# Patient Record
Sex: Female | Born: 1970 | Race: Black or African American | Hispanic: No | Marital: Married | Smoking: Never smoker
Health system: Southern US, Community
[De-identification: ages and names within clinical notes are randomized; demographics above are authoritative.]

## PROBLEM LIST (undated history)

## (undated) DIAGNOSIS — D219 Benign neoplasm of connective and other soft tissue, unspecified: Secondary | ICD-10-CM

## (undated) DIAGNOSIS — N7011 Chronic salpingitis: Secondary | ICD-10-CM

## (undated) HISTORY — DX: Benign neoplasm of connective and other soft tissue, unspecified: D21.9

## (undated) HISTORY — PX: OTHER SURGICAL HISTORY: SHX169

## (undated) HISTORY — DX: Chronic salpingitis: N70.11

---

## 2004-06-04 ENCOUNTER — Emergency Department (HOSPITAL_COMMUNITY): Admission: EM | Admit: 2004-06-04 | Discharge: 2004-06-04 | Payer: Self-pay | Admitting: Emergency Medicine

## 2013-08-13 ENCOUNTER — Ambulatory Visit: Payer: BC Managed Care – PPO | Admitting: Emergency Medicine

## 2013-08-13 VITALS — BP 115/70 | HR 92 | Temp 97.9°F | Resp 18 | Ht 65.0 in | Wt 169.9 lb

## 2013-08-13 DIAGNOSIS — R519 Headache, unspecified: Secondary | ICD-10-CM

## 2013-08-13 DIAGNOSIS — R51 Headache: Secondary | ICD-10-CM

## 2013-08-13 DIAGNOSIS — R300 Dysuria: Secondary | ICD-10-CM

## 2013-08-13 DIAGNOSIS — R350 Frequency of micturition: Secondary | ICD-10-CM

## 2013-08-13 LAB — COMPREHENSIVE METABOLIC PANEL
ALK PHOS: 52 U/L (ref 39–117)
ALT: 12 U/L (ref 0–35)
AST: 21 U/L (ref 0–37)
Albumin: 4.2 g/dL (ref 3.5–5.2)
BILIRUBIN TOTAL: 0.8 mg/dL (ref 0.2–1.2)
BUN: 9 mg/dL (ref 6–23)
CO2: 25 mEq/L (ref 19–32)
CREATININE: 0.49 mg/dL — AB (ref 0.50–1.10)
Calcium: 9 mg/dL (ref 8.4–10.5)
Chloride: 104 mEq/L (ref 96–112)
Glucose, Bld: 79 mg/dL (ref 70–99)
Potassium: 3.8 mEq/L (ref 3.5–5.3)
Sodium: 136 mEq/L (ref 135–145)
Total Protein: 7.6 g/dL (ref 6.0–8.3)

## 2013-08-13 LAB — POCT CBC
Granulocyte percent: 58.6 %G (ref 37–80)
HCT, POC: 34.7 % — AB (ref 37.7–47.9)
Hemoglobin: 10.4 g/dL — AB (ref 12.2–16.2)
Lymph, poc: 1.8 (ref 0.6–3.4)
MCH: 25.6 pg — AB (ref 27–31.2)
MCHC: 30 g/dL — AB (ref 31.8–35.4)
MCV: 85.3 fL (ref 80–97)
MID (CBC): 0.4 (ref 0–0.9)
MPV: 8.3 fL (ref 0–99.8)
PLATELET COUNT, POC: 337 10*3/uL (ref 142–424)
POC Granulocyte: 3 (ref 2–6.9)
POC LYMPH %: 33.8 % (ref 10–50)
POC MID %: 7.6 % (ref 0–12)
RBC: 4.07 M/uL (ref 4.04–5.48)
RDW, POC: 19.2 %
WBC: 5.2 10*3/uL (ref 4.6–10.2)

## 2013-08-13 LAB — POCT URINALYSIS DIPSTICK
BILIRUBIN UA: NEGATIVE
Blood, UA: NEGATIVE
Glucose, UA: NEGATIVE
KETONES UA: NEGATIVE
Leukocytes, UA: NEGATIVE
Nitrite, UA: NEGATIVE
PH UA: 6
Protein, UA: NEGATIVE
Spec Grav, UA: <=1.005
Urobilinogen, UA: 0.2

## 2013-08-13 LAB — GLUCOSE, POCT (MANUAL RESULT ENTRY): POC Glucose: 88 mg/dl (ref 70–99)

## 2013-08-13 LAB — POCT UA - MICROSCOPIC ONLY
Bacteria, U Microscopic: NEGATIVE
CRYSTALS, UR, HPF, POC: NEGATIVE
Casts, Ur, LPF, POC: NEGATIVE
Epithelial cells, urine per micros: NEGATIVE
Mucus, UA: NEGATIVE
RBC, URINE, MICROSCOPIC: NEGATIVE
WBC, Ur, HPF, POC: NEGATIVE
Yeast, UA: NEGATIVE

## 2013-08-13 LAB — POCT URINE PREGNANCY: Preg Test, Ur: NEGATIVE

## 2013-08-13 MED ORDER — FLUTICASONE PROPIONATE 50 MCG/ACT NA SUSP: 2.0000 | Freq: Every day | NASAL | Status: DC

## 2013-08-13 MED ORDER — OXYCODONE-ACETAMINOPHEN 5-325 MG PO TABS: 1.0000 | ORAL_TABLET | Freq: Three times a day (TID) | ORAL | Status: DC | PRN

## 2013-08-13 NOTE — Patient Instructions (Signed)
Please head straight over to [place] at [facility] for a CT Scan to be completed.

## 2013-08-13 NOTE — Progress Notes (Signed)
Subjective:    Patient ID: Kathy Craig, female    DOB: [birth year], 43 y.o.   MRN: [id number]  HPI Patient woke up yesterday with urinary frequency and terrible headache. Throbbing headache at the back of her head. Took Alleve 2 tablets yesterday morning and 2 more yesterday afternoon. No relief. Denies neck pain, shoulder pain, unusual activity.  Patient moved back to [place] from [facility] about 1 year ago, has not established with a PCP. Last CPE about 1 year ago.  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   Review of Systems Urinary frequency, small amount, no hematuria, no burning, some lower abdominal pressure, no odor, no fever but felt warm, some lower back pain.No nausea or vomiting. No ear pain or pressure. No cough. No SOB. No CP. No visual changes, no weakness, numbness or tingling. No neck or shoulder pain. No numbness or tingling.     Objective:   Physical Exam  Vitals reviewed. Constitutional: She is oriented to person, place, and time. She appears well-developed and well-nourished.  HENT:  Head: Normocephalic and atraumatic.  Right Ear: Tympanic membrane, external ear and ear canal normal.  Left Ear: Tympanic membrane, external ear and ear canal normal.  Nose: Mucosal edema present. Right sinus exhibits no maxillary sinus tenderness and no frontal sinus tenderness. Left sinus exhibits no maxillary sinus tenderness and no frontal sinus tenderness.  Mouth/Throat: Uvula is midline and mucous membranes are normal. Posterior oropharyngeal erythema present.  Eyes: Conjunctivae and EOM are normal. Pupils are equal, round, and reactive to light. Right eye exhibits no discharge. Left eye exhibits no discharge. No scleral icterus.  Fundoscopic exam:      The right eye shows no arteriolar narrowing, no AV nicking, no  exudate, no hemorrhage and no papilledema. The right eye shows red reflex.       The left eye shows no AV nicking, no exudate, no hemorrhage and no papilledema. The left eye shows red reflex and venous pulsations.  Neck: Normal range of motion. Neck supple.  Cardiovascular: Normal rate, regular rhythm and normal heart sounds.   Pulmonary/Chest: Effort normal and breath sounds normal.  Abdominal: Soft. Bowel sounds are normal. She exhibits no distension and no mass. There is no tenderness. There is no rebound, no guarding and no CVA tenderness.  Musculoskeletal: Normal range of motion.  Lymphadenopathy:    She has no cervical adenopathy.  Neurological: She is alert and oriented to person, place, and time. She has normal reflexes.  Skin: Skin is warm and dry.  Psychiatric: She has a normal mood and affect. Her behavior is normal. Judgment and thought content normal.    Results for orders placed in visit on 08/13/13  POCT UA - MICROSCOPIC ONLY      Result Value Ref Range   WBC, Ur, HPF, POC neg     RBC, urine, microscopic neg     Bacteria, U Microscopic neg     Mucus, UA neg     Epithelial cells, urine per micros neg     Crystals, Ur, HPF, POC neg     Casts, Ur, LPF, POC neg     Yeast, UA neg    POCT URINALYSIS DIPSTICK      Result Value Ref Range   Color, UA yellow     Clarity, UA  clear     Glucose, UA neg     Bilirubin, UA neg     Ketones, UA neg     Spec Grav, UA <=1.005     Blood, UA neg     pH, UA 6.0     Protein, UA neg     Urobilinogen, UA 0.2     Nitrite, UA neg     Leukocytes, UA Negative    POCT CBC      Result Value Ref Range   WBC 5.2  4.6 - 10.2 K/uL   Lymph, poc 1.8  0.6 - 3.4   POC LYMPH PERCENT 33.8  10 - 50 %L   MID (cbc) 0.4  0 - 0.9   POC MID % 7.6  0 - 12 %M   POC Granulocyte 3.0  2 - 6.9   Granulocyte percent 58.6  37 - 80 %G   RBC 4.07  4.04 - 5.48 M/uL   Hemoglobin 10.4 (*) 12.2 - 16.2 g/dL   HCT, POC 34.7 (*) 37.7 - 47.9 %   MCV 85.3  80 - 97 fL    MCH, POC 25.6 (*) 27 - 31.2 pg   MCHC 30.0 (*) 31.8 - 35.4 g/dL   RDW, POC 19.2     Platelet Count, POC 337  142 - 424 K/uL   MPV 8.3  0 - 99.8 fL  GLUCOSE, POCT (MANUAL RESULT ENTRY)      Result Value Ref Range   POC Glucose 88  70 - 99 mg/dl  POCT URINE PREGNANCY      Result Value Ref Range   Preg Test, Ur Negative          Assessment & Plan:  Best number [phone]  Discussed with Dr. Everlene Farrier who examined patient  1. Dysuria- patient with frequency more than dysuria - POCT UA - Microscopic Only- negative - POCT urinalysis dipstick- negative - POCT CBC- mild anemia, no prior labs to compare  - Comprehensive metabolic panel  2. Occipital headache - Comprehensive metabolic panel - CT Head Wo Contrast- patient sent to [facility] for stat head CT  3. Urinary frequency - POCT CBC - Comprehensive metabolic panel - POCT glucose (manual entry) - POCT urine pregnancy- negative  -Noticed that stat head CT was not done at 6:30 pm, patient was seen in office and left around 2:15. According to [facility], the CT is scheduled for tomorrow, 08/14/2013. Unsure why CT rescheduled. Called patient and left her a voice mail asking her to call [facility].  Elby Beck, FNP-BC  [facility]  08/13/2013 6:38 PM

## 2013-08-14 ENCOUNTER — Telehealth: Payer: Self-pay | Admitting: Family Medicine

## 2013-08-14 ENCOUNTER — Ambulatory Visit
Admission: RE | Admit: 2013-08-14 | Discharge: 2013-08-14 | Disposition: A | Discharge status: Home / Self Care | Payer: BC Managed Care – PPO | Source: Ambulatory Visit | Attending: Family Medicine | Admitting: Family Medicine

## 2013-08-14 DIAGNOSIS — R519 Headache, unspecified: Secondary | ICD-10-CM

## 2013-08-14 DIAGNOSIS — R51 Headache: Principal | ICD-10-CM

## 2013-08-14 NOTE — Telephone Encounter (Signed)
Received call report from [facility] CT head with out contrast. CT was normal, no acute intracranial pathology. Discussed results with Dr. Everlene Farrier and per Dr. Everlene Farrier call Ms. Walkup let her know CT normal. See Dr. Everlene Farrier tomorrow 08/15/13 10-5 or Monday 08/18/13 8-12 if HA not starting to improve/go away. Called patient and left message to return call.

## 2013-08-14 NOTE — Telephone Encounter (Signed)
Patient returned call, notified of results and voiced understanding.  Her HA has improved but has not gone away. States she will come in tomorrow or Monday to see Dr. Everlene Farrier.

## 2013-09-19 ENCOUNTER — Ambulatory Visit: Payer: BC Managed Care – PPO | Admitting: Family Medicine

## 2014-03-03 ENCOUNTER — Encounter: Payer: BC Managed Care – PPO | Admitting: Certified Nurse Midwife

## 2014-04-20 ENCOUNTER — Ambulatory Visit (INDEPENDENT_AMBULATORY_CARE_PROVIDER_SITE_OTHER): Payer: BLUE CROSS/BLUE SHIELD | Admitting: Certified Nurse Midwife

## 2014-04-20 ENCOUNTER — Encounter: Payer: Self-pay | Admitting: Certified Nurse Midwife

## 2014-04-20 VITALS — BP 110/80 | HR 68 | Resp 16 | Ht 64.75 in | Wt 169.0 lb

## 2014-04-20 DIAGNOSIS — Z01419 Encounter for gynecological examination (general) (routine) without abnormal findings: Secondary | ICD-10-CM

## 2014-04-20 DIAGNOSIS — N852 Hypertrophy of uterus: Secondary | ICD-10-CM

## 2014-04-20 DIAGNOSIS — Z124 Encounter for screening for malignant neoplasm of cervix: Secondary | ICD-10-CM

## 2014-04-20 DIAGNOSIS — Z0000 Encounter for general adult medical examination without abnormal findings: Secondary | ICD-10-CM

## 2014-04-20 LAB — HEMOGLOBIN, FINGERSTICK: Hemoglobin, fingerstick: 13.2 g/dL (ref 12.0–16.0)

## 2014-04-20 LAB — POCT URINALYSIS DIPSTICK
BILIRUBIN UA: NEGATIVE
Blood, UA: NEGATIVE
Glucose, UA: NEGATIVE
Ketones, UA: NEGATIVE
Leukocytes, UA: NEGATIVE
Nitrite, UA: NEGATIVE
Protein, UA: NEGATIVE
UROBILINOGEN UA: NEGATIVE
pH, UA: 5

## 2014-04-20 LAB — COMPREHENSIVE METABOLIC PANEL
ALK PHOS: 62 U/L (ref 39–117)
ALT: 9 U/L (ref 0–35)
AST: 16 U/L (ref 0–37)
Albumin: 4.2 g/dL (ref 3.5–5.2)
BUN: 15 mg/dL (ref 6–23)
CALCIUM: 9 mg/dL (ref 8.4–10.5)
CO2: 25 mEq/L (ref 19–32)
CREATININE: 0.55 mg/dL (ref 0.50–1.10)
Chloride: 104 mEq/L (ref 96–112)
Glucose, Bld: 78 mg/dL (ref 70–99)
Potassium: 4.4 mEq/L (ref 3.5–5.3)
SODIUM: 138 meq/L (ref 135–145)
TOTAL PROTEIN: 7.6 g/dL (ref 6.0–8.3)
Total Bilirubin: 0.4 mg/dL (ref 0.2–1.2)

## 2014-04-20 LAB — CBC
HCT: 41 % (ref 36.0–46.0)
Hemoglobin: 13.5 g/dL (ref 12.0–15.0)
MCH: 28 pg (ref 26.0–34.0)
MCHC: 32.9 g/dL (ref 30.0–36.0)
MCV: 85.1 fL (ref 78.0–100.0)
MPV: 8.7 fL (ref 8.6–12.4)
Platelets: 466 10*3/uL — ABNORMAL HIGH (ref 150–400)
RBC: 4.82 MIL/uL (ref 3.87–5.11)
RDW: 14.2 % (ref 11.5–15.5)
WBC: 13.7 10*3/uL — AB (ref 4.0–10.5)

## 2014-04-20 LAB — LIPID PANEL
Cholesterol: 187 mg/dL (ref 0–200)
HDL: 59 mg/dL (ref >39)
LDL Cholesterol: 110 mg/dL — ABNORMAL HIGH (ref 0–99)
TRIGLYCERIDES: 89 mg/dL (ref <150)
Total CHOL/HDL Ratio: 3.2 Ratio
VLDL: 18 mg/dL (ref 0–40)

## 2014-04-20 LAB — TSH: TSH: 1.972 u[IU]/mL (ref 0.350–4.500)

## 2014-04-20 NOTE — Progress Notes (Addendum)
43 y.o. . Married  African American g3 p1021 Fe here to establish gyn care and for annual exam. Periods heavy due to fibroid history, with previous PUS per patient she has 3.  Patient no health issues other having some insomnia. Watches TV prior to sleep in bedroom, and then wakes frequently. Recent move to area. Has not established PCP care yet. No other health issues today.  Patient's last menstrual period was 04/13/2014.          Sexually active: Yes.    The current method of family planning is none.    Exercising: No.  exercise Smoker:  no  Health Maintenance: Pap:  2013 neg per patient No abnormal pap smears per patient MMG:  2014 neg per patient Colonoscopy:  none BMD:   none TDaP:  Unsure will request records Labs: Poct urine-, Hgb-13.2 Self breast exam: done occ   reports that she has never smoked. She does not have any smokeless tobacco history on file. She reports that she drinks about 0.6 - 1.2 oz of alcohol per week. She reports that she does not use illicit drugs.  Past Medical History  Diagnosis Date  . Fibroid     Past Surgical History  Procedure Laterality Date  . Mirena      removed 2013    No current outpatient prescriptions on file.   No current facility-administered medications for this visit.    Family History  Problem Relation Age of Onset  . Heart disease Mother   . Hypertension Mother   . Hyperlipidemia Mother     ROS:  Pertinent items are noted in HPI.  Otherwise, a comprehensive ROS was negative.  Exam:   BP 110/80 mmHg  Pulse 68  Resp 16  Ht 5' 4.75" (1.645 m)  Wt 169 lb (76.658 kg)  BMI 28.33 kg/m2  LMP 04/13/2014 Height: 5' 4.75" (164.5 cm) Ht Readings from Last 3 Encounters:  04/20/14 5' 4.75" (1.645 m)  08/13/13 5' 5"  (1.651 m)    General appearance: alert, cooperative and appears stated age Head: Normocephalic, without obvious abnormality, atraumatic Neck: no adenopathy, supple, symmetrical, trachea midline and thyroid normal  to inspection and palpation Lungs: clear to auscultation bilaterally Breasts: normal appearance, no masses or tenderness, No nipple retraction or dimpling, No nipple discharge or bleeding, No axillary or supraclavicular adenopathy Heart: regular rate and rhythm Abdomen: soft, non-tender; no masses,  no organomegaly Extremities: extremities normal, atraumatic, no cyanosis or edema Skin: Skin color, texture, turgor normal. No rashes or lesions Lymph nodes: Cervical, supraclavicular, and axillary nodes normal. No abnormal inguinal nodes palpated Neurologic: Grossly normal   Pelvic: External genitalia:  no lesions              Urethra:  normal appearing urethra with no masses, tenderness or lesions              Bartholin's and Skene's: normal                 Vagina: normal appearing vagina with normal color and discharge, no lesions              Cervix: normal, no lesions, non tender              Pap taken: Yes.   Bimanual Exam:  Uterus:  enlarged, 14-16 week size firm, fibroid feel weeks size              Adnexa: normal adnexa, no mass, fullness, tenderness and palpable ? left, not palpable  right               Rectovaginal: Confirms               Anus:  normal sphincter tone, no lesions  Chaperone present: Yes  A:  Well Woman with normal exam  Contraception none  Enlarged uterus, history of fibroids  P:   Reviewed health and wellness pertinent to exam  Pap smear taken today  counseled on breast self exam, mammography screening, adequate intake of calcium and vitamin D, diet and exercise  return annually or prn  An After Visit Summary was printed and given to the patient.

## 2014-04-20 NOTE — Patient Instructions (Signed)

## 2014-04-20 NOTE — Progress Notes (Signed)
Reviewed personally.  M. Suzanne Mertice Uffelman, MD.  

## 2014-04-21 ENCOUNTER — Other Ambulatory Visit: Payer: Self-pay | Admitting: Certified Nurse Midwife

## 2014-04-21 ENCOUNTER — Telehealth: Payer: Self-pay

## 2014-04-21 DIAGNOSIS — R899 Unspecified abnormal finding in specimens from other organs, systems and tissues: Secondary | ICD-10-CM

## 2014-04-21 LAB — VITAMIN D 25 HYDROXY (VIT D DEFICIENCY, FRACTURES): VIT D 25 HYDROXY: 7 ng/mL — AB (ref 30–100)

## 2014-04-21 MED ORDER — ERGOCALCIFEROL 1.25 MG (50000 UT) PO CAPS: 50000.0000 [IU] | ORAL_CAPSULE | ORAL | Status: DC

## 2014-04-21 NOTE — Telephone Encounter (Signed)
-----   Message from Regina Eck, CNM sent at 04/21/2014  8:07 AM EST ----- Notify patient that her TSH is normal Liver, kidney,glucose panel is normal Lipid panel (Cholesterol) is optimal level Vitamin D is very low, needs Rx protocol CBC is normal except for elevated WBC count, which she had a cold at visit this could be the cause and platelets mild elevation, needs recheck in one week order please schedule Pap smear pending

## 2014-04-21 NOTE — Telephone Encounter (Signed)
Notes Recorded by Olga Millers, [place] on 04/21/2014 at 2:32 PM Patient notified of lab results Aware to take vitamin d 50,000iu once weekly for 24mhs then will stop rx & switch to otc vit d 800iu daily for 268ms. Vitamin D recheck scheduled for 07/20/14 at 11:30 CBC recheck scheduled for 04/27/14 at 11:30 Vitamin D 50,000 iu's #30/0 rfs sent in to [facility], patient is aware

## 2014-04-21 NOTE — Addendum Note (Signed)
Addended by: Alfonzo Feller on: 04/21/2014 02:33 PM   Modules accepted: Miquel Dunn

## 2014-04-21 NOTE — Telephone Encounter (Signed)
lmtcb

## 2014-04-21 NOTE — Telephone Encounter (Signed)
Call to patient to go over benefits and schedule PUS. No answer. Mailbox is full.

## 2014-04-22 LAB — IPS PAP TEST WITH HPV

## 2014-04-27 ENCOUNTER — Other Ambulatory Visit (INDEPENDENT_AMBULATORY_CARE_PROVIDER_SITE_OTHER): Payer: BLUE CROSS/BLUE SHIELD

## 2014-04-27 DIAGNOSIS — R899 Unspecified abnormal finding in specimens from other organs, systems and tissues: Secondary | ICD-10-CM

## 2014-04-27 LAB — CBC WITH DIFFERENTIAL/PLATELET
Basophils Absolute: 0.1 10*3/uL (ref 0.0–0.1)
Basophils Relative: 1 % (ref 0–1)
EOS ABS: 0.2 10*3/uL (ref 0.0–0.7)
Eosinophils Relative: 3 % (ref 0–5)
HCT: 31.3 % — ABNORMAL LOW (ref 36.0–46.0)
HEMOGLOBIN: 9.1 g/dL — AB (ref 12.0–15.0)
LYMPHS ABS: 1.8 10*3/uL (ref 0.7–4.0)
Lymphocytes Relative: 30 % (ref 12–46)
MCH: 21.8 pg — AB (ref 26.0–34.0)
MCHC: 29.1 g/dL — AB (ref 30.0–36.0)
MCV: 75.1 fL — AB (ref 78.0–100.0)
MONO ABS: 0.5 10*3/uL (ref 0.1–1.0)
MPV: 8.7 fL (ref 8.6–12.4)
Monocytes Relative: 8 % (ref 3–12)
Neutro Abs: 3.5 10*3/uL (ref 1.7–7.7)
Neutrophils Relative %: 58 % (ref 43–77)
PLATELETS: 405 10*3/uL — AB (ref 150–400)
RBC: 4.17 MIL/uL (ref 3.87–5.11)
RDW: 17.8 % — ABNORMAL HIGH (ref 11.5–15.5)
WBC: 6.1 10*3/uL (ref 4.0–10.5)

## 2014-04-27 NOTE — Telephone Encounter (Signed)
Per sabrina in pre-cert, okay to close encounter

## 2014-04-28 ENCOUNTER — Other Ambulatory Visit: Payer: Self-pay | Admitting: Certified Nurse Midwife

## 2014-04-28 ENCOUNTER — Telehealth: Payer: Self-pay | Admitting: Certified Nurse Midwife

## 2014-04-28 DIAGNOSIS — D649 Anemia, unspecified: Secondary | ICD-10-CM

## 2014-04-28 MED ORDER — FUSION PLUS PO CAPS: 1.0000 | ORAL_CAPSULE | Freq: Every day | ORAL | Status: DC

## 2014-04-28 NOTE — Telephone Encounter (Signed)
Returning Joy's call. Routing to [employer] per [employer].

## 2014-04-28 NOTE — Telephone Encounter (Signed)
Patient notified of results as written by provider

## 2014-04-29 ENCOUNTER — Telehealth: Payer: Self-pay | Admitting: Certified Nurse Midwife

## 2014-04-29 NOTE — Telephone Encounter (Signed)
Pt says that the camomile tea is not helping her sleep. Would like to know what else Debbie suggest for her to do.

## 2014-04-29 NOTE — Telephone Encounter (Signed)
Patient can also try OTC sleep aid. She has tried Rx before with no change

## 2014-04-29 NOTE — Telephone Encounter (Signed)
Routing to Regina Eck CNM to review and advise.

## 2014-04-30 NOTE — Telephone Encounter (Signed)
Called patient. She is given message from [place]. She states she has used Z-Quil and other treatments without relief. Suggested patient obtain primary care for evaluation and treatment for insomnia. She hast a list of PCP that Regina Eck CNM have given her and she will try to call for an appointment. She will call back if she needs assistance. Routing to provider for final review. Patient agreeable to disposition. Will close encounter

## 2014-05-04 NOTE — Addendum Note (Signed)
Addended by: Regina Eck on: 05/04/2014 03:13 PM   Modules accepted: Miquel Dunn

## 2014-05-27 ENCOUNTER — Other Ambulatory Visit (INDEPENDENT_AMBULATORY_CARE_PROVIDER_SITE_OTHER): Payer: BLUE CROSS/BLUE SHIELD

## 2014-05-27 DIAGNOSIS — D649 Anemia, unspecified: Secondary | ICD-10-CM

## 2014-05-27 LAB — FERRITIN: Ferritin: 15 ng/mL (ref 10–291)

## 2014-05-27 LAB — IBC PANEL
%SAT: 9 % — ABNORMAL LOW (ref 20–55)
TIBC: 369 ug/dL (ref 250–470)
UIBC: 337 ug/dL (ref 125–400)

## 2014-05-27 LAB — IRON: IRON: 32 ug/dL — AB (ref 42–145)

## 2014-05-28 ENCOUNTER — Telehealth: Payer: Self-pay

## 2014-05-28 LAB — CBC
HCT: 31.3 % — ABNORMAL LOW (ref 36.0–46.0)
HEMOGLOBIN: 9.6 g/dL — AB (ref 12.0–15.0)
MCH: 24.7 pg — ABNORMAL LOW (ref 26.0–34.0)
MCHC: 30.7 g/dL (ref 30.0–36.0)
MCV: 80.5 fL (ref 78.0–100.0)
MPV: 9.2 fL (ref 8.6–12.4)
Platelets: 308 10*3/uL (ref 150–400)
RBC: 3.89 MIL/uL (ref 3.87–5.11)
RDW: 21.5 % — ABNORMAL HIGH (ref 11.5–15.5)
WBC: 5 10*3/uL (ref 4.0–10.5)

## 2014-05-28 NOTE — Telephone Encounter (Signed)
Patient notified of results. See lab

## 2014-05-28 NOTE — Telephone Encounter (Signed)
lmtcb

## 2014-05-28 NOTE — Telephone Encounter (Signed)
-----   Message from Antonietta Barcelona, [place] sent at 05/28/2014  1:22 PM EDT ----- Please check with her and see if menses bleeding has increase.  At [facility] 04/20/14 her HGB was 13.2 and now 9.6.  She needs to start on iron Slow Fe daily.  She needs to have iron studies  With TIBC and iron done along with Vit D in 3 months.  The Ferritin level is normal

## 2014-05-28 NOTE — Telephone Encounter (Signed)
Return call to [place].

## 2014-06-01 NOTE — Addendum Note (Signed)
Addended by: Regina Eck on: 06/01/2014 08:32 AM   Modules accepted: Miquel Dunn

## 2014-06-02 ENCOUNTER — Other Ambulatory Visit: Payer: Self-pay | Admitting: Certified Nurse Midwife

## 2014-06-02 DIAGNOSIS — D509 Iron deficiency anemia, unspecified: Secondary | ICD-10-CM

## 2014-07-20 ENCOUNTER — Other Ambulatory Visit: Payer: BLUE CROSS/BLUE SHIELD

## 2014-07-20 ENCOUNTER — Other Ambulatory Visit (INDEPENDENT_AMBULATORY_CARE_PROVIDER_SITE_OTHER): Payer: BLUE CROSS/BLUE SHIELD

## 2014-07-20 DIAGNOSIS — D509 Iron deficiency anemia, unspecified: Secondary | ICD-10-CM

## 2014-07-20 DIAGNOSIS — E559 Vitamin D deficiency, unspecified: Secondary | ICD-10-CM

## 2014-07-21 ENCOUNTER — Telehealth: Payer: Self-pay

## 2014-07-21 ENCOUNTER — Other Ambulatory Visit: Payer: Self-pay | Admitting: Certified Nurse Midwife

## 2014-07-21 DIAGNOSIS — D649 Anemia, unspecified: Secondary | ICD-10-CM

## 2014-07-21 DIAGNOSIS — D509 Iron deficiency anemia, unspecified: Secondary | ICD-10-CM

## 2014-07-21 LAB — CBC
HCT: 31.5 % — ABNORMAL LOW (ref 36.0–46.0)
Hemoglobin: 9.8 g/dL — ABNORMAL LOW (ref 12.0–15.0)
MCH: 26.7 pg (ref 26.0–34.0)
MCHC: 31.1 g/dL (ref 30.0–36.0)
MCV: 85.8 fL (ref 78.0–100.0)
MPV: 9.3 fL (ref 8.6–12.4)
PLATELETS: 304 10*3/uL (ref 150–400)
RBC: 3.67 MIL/uL — ABNORMAL LOW (ref 3.87–5.11)
RDW: 17.2 % — AB (ref 11.5–15.5)
WBC: 6.8 10*3/uL (ref 4.0–10.5)

## 2014-07-21 LAB — IBC PANEL
%SAT: 12 % — ABNORMAL LOW (ref 20–55)
TIBC: 386 ug/dL (ref 250–470)
UIBC: 341 ug/dL (ref 125–400)

## 2014-07-21 LAB — IRON: Iron: 45 ug/dL (ref 42–145)

## 2014-07-21 LAB — VITAMIN D 25 HYDROXY (VIT D DEFICIENCY, FRACTURES): VIT D 25 HYDROXY: 28 ng/mL — AB (ref 30–100)

## 2014-07-21 MED ORDER — FUSION PLUS PO CAPS: 1.0000 | ORAL_CAPSULE | Freq: Every day | ORAL | Status: DC

## 2014-07-21 NOTE — Telephone Encounter (Signed)
lmtcb

## 2014-07-21 NOTE — Telephone Encounter (Signed)
-----   Message from Regina Eck, CNM sent at 07/21/2014 10:58 AM EDT ----- Notify patient that iron level is normal at 45, normal is >42 Iron saturation is still low at 12 better improved from 9 normal > 20 be sure take iron with Orange juice if possible to increase absorption CBC still indicates anemia hgb. 9.8 from 9.6 Continue Fusion Plus as directed recheck  In 2 months order in Vitamin D is 28  protocol

## 2014-07-23 NOTE — Telephone Encounter (Signed)
Patient notified of results. See lab

## 2014-07-30 ENCOUNTER — Telehealth: Payer: Self-pay

## 2014-07-30 NOTE — Telephone Encounter (Signed)
I put her on my reminder list also

## 2014-07-30 NOTE — Telephone Encounter (Signed)
-----   Message from Kathy Craig, CNM sent at 07/29/2014  8:00 AM EDT ----- Can we follow up with this patient, had enlarged uterus in 2/16 and was to schedule PUS, no record of being done, I know Gabriel Cirri had talked with her

## 2014-07-30 NOTE — Telephone Encounter (Signed)
Spoke with patient. Patient states she was unable to contact the [facility] regarding payment plans for PUS. "I just have not had the time. Can't I have a payment plan with your office?" Advised that full amount for PUS is due at the time of appointment with our office but can be set up with a payment plan at [facility]. Patient is concerned about the cost being more at [facility] over time. Advised patient I would be happy to schedule PUS here or at [facility]. If scheduled at [facility] will need to speak with them regarding costs and payment options. Patient states "I will just have to think about it and call you guys back."

## 2014-08-28 ENCOUNTER — Other Ambulatory Visit (HOSPITAL_COMMUNITY): Payer: Self-pay | Admitting: *Deleted

## 2014-08-31 ENCOUNTER — Ambulatory Visit (HOSPITAL_COMMUNITY)
Admission: RE | Admit: 2014-08-31 | Discharge: 2014-08-31 | Disposition: A | Discharge status: Home / Self Care | Payer: BLUE CROSS/BLUE SHIELD | Source: Ambulatory Visit | Attending: Internal Medicine | Admitting: Internal Medicine

## 2014-08-31 DIAGNOSIS — D649 Anemia, unspecified: Secondary | ICD-10-CM | POA: Insufficient documentation

## 2014-08-31 MED ORDER — SODIUM CHLORIDE 0.9 % IV SOLN
510.0000 mg | Freq: Once | INTRAVENOUS | Status: AC
  Administered 2014-08-31: 510 mg via INTRAVENOUS
  Filled 2014-08-31: qty 17

## 2014-09-10 ENCOUNTER — Telehealth: Payer: Self-pay

## 2014-09-10 NOTE — Telephone Encounter (Signed)
Spoke with patient regarding scheduling of PUS. Patient states "I would like to have it done but I can not afford to pay all of the money up front so I can not have it done." Advised patient we can schedule at [facility] and they can set up payment plan. Patient declines. "It will be even more expensive for me to do it that way." Advised patient I will let Regina Eck CNM know and if there are any other alternative recommendations our office will give her a call.

## 2014-09-10 NOTE — Telephone Encounter (Signed)
Encounter opened in error. Please see telephone note from 07/30/2014.

## 2014-09-21 ENCOUNTER — Telehealth: Payer: Self-pay | Admitting: Certified Nurse Midwife

## 2014-09-21 NOTE — Telephone Encounter (Signed)
Spoke with Ms Kathy Craig and reviewed benefits and payment plan ok'd by Dr Sabra Heck. Patient agreeable. Scheduled for 10/08/14 per patient due to her work schedule. Patient requested morning appointment. Scheduled with Dr Quincy Simmonds @930am . Notes updated in guarantor account

## 2014-09-22 ENCOUNTER — Telehealth: Payer: Self-pay | Admitting: Certified Nurse Midwife

## 2014-09-22 ENCOUNTER — Telehealth: Payer: Self-pay

## 2014-09-22 ENCOUNTER — Other Ambulatory Visit: Payer: Self-pay

## 2014-09-22 ENCOUNTER — Other Ambulatory Visit: Payer: BLUE CROSS/BLUE SHIELD

## 2014-09-22 DIAGNOSIS — N852 Hypertrophy of uterus: Secondary | ICD-10-CM

## 2014-09-22 DIAGNOSIS — R790 Abnormal level of blood mineral: Secondary | ICD-10-CM

## 2014-09-22 NOTE — Telephone Encounter (Signed)
Left message to call and reschedule missed lab appointment.

## 2014-09-22 NOTE — Telephone Encounter (Signed)
Patient is scheduled for PUS on 10/08/2014 at 9:30am with 10am consult with Dr.Silva.  Routing to provider for final review. Patient agreeable to disposition. Will close encounter.   Patient aware provider will review message and nurse will return call if any additional advice or change of disposition.

## 2014-09-22 NOTE — Telephone Encounter (Signed)
Encounter opened in error

## 2014-10-07 ENCOUNTER — Other Ambulatory Visit: Payer: Self-pay | Admitting: *Deleted

## 2014-10-07 NOTE — Telephone Encounter (Signed)
Patient called to reschedule [place]. Rescheduled with Becky for 10/15/14

## 2014-10-08 ENCOUNTER — Other Ambulatory Visit: Payer: BLUE CROSS/BLUE SHIELD

## 2014-10-08 ENCOUNTER — Other Ambulatory Visit: Payer: BLUE CROSS/BLUE SHIELD | Admitting: Obstetrics and Gynecology

## 2014-10-15 ENCOUNTER — Ambulatory Visit (INDEPENDENT_AMBULATORY_CARE_PROVIDER_SITE_OTHER): Payer: BLUE CROSS/BLUE SHIELD

## 2014-10-15 ENCOUNTER — Encounter: Payer: Self-pay | Admitting: Obstetrics and Gynecology

## 2014-10-15 ENCOUNTER — Ambulatory Visit (INDEPENDENT_AMBULATORY_CARE_PROVIDER_SITE_OTHER): Payer: BLUE CROSS/BLUE SHIELD | Admitting: Obstetrics and Gynecology

## 2014-10-15 VITALS — BP 110/70 | HR 70 | Ht 64.75 in | Wt 161.0 lb

## 2014-10-15 DIAGNOSIS — D259 Leiomyoma of uterus, unspecified: Secondary | ICD-10-CM

## 2014-10-15 DIAGNOSIS — N7011 Chronic salpingitis: Secondary | ICD-10-CM

## 2014-10-15 DIAGNOSIS — N852 Hypertrophy of uterus: Secondary | ICD-10-CM | POA: Diagnosis not present

## 2014-10-15 DIAGNOSIS — N921 Excessive and frequent menstruation with irregular cycle: Secondary | ICD-10-CM

## 2014-10-15 HISTORY — DX: Chronic salpingitis: N70.11

## 2014-10-15 MED ORDER — IBUPROFEN 800 MG PO TABS: 800.0000 mg | ORAL_TABLET | Freq: Three times a day (TID) | ORAL | Status: DC | PRN

## 2014-10-15 NOTE — Patient Instructions (Signed)
Endometrial Biopsy Endometrial biopsy is a procedure in which a tissue sample is taken from inside the uterus. The tissue sample is then looked at under a microscope to see if the tissue is normal or abnormal. The endometrium is the lining of the uterus. This procedure helps determine where you are in your menstrual cycle and how hormone levels are affecting the lining of the uterus. This procedure may also be used to evaluate uterine bleeding or to diagnose endometrial cancer, tuberculosis, polyps, or inflammatory conditions.  LET [facility] CARE PROVIDER KNOW ABOUT:  Any allergies you have.  All medicines you are taking, including vitamins, herbs, eye drops, creams, and over-the-counter medicines.  Previous problems you or members of your family have had with the use of anesthetics.  Any blood disorders you have.  Previous surgeries you have had.  Medical conditions you have.  Possibility of pregnancy. RISKS AND COMPLICATIONS Generally, this is a safe procedure. However, as with any procedure, complications can occur. Possible complications include:  Bleeding.  Pelvic infection.  Puncture of the uterine wall with the biopsy device (rare). BEFORE THE PROCEDURE   Keep a record of your menstrual cycles as directed by your health care provider. You may need to schedule your procedure for a specific time in your cycle.  You may want to bring a sanitary pad to wear home after the procedure.  Arrange for someone to drive you home after the procedure if you will be given a medicine to help you relax (sedative). PROCEDURE   You may be given a sedative to relax you.  You will lie on an exam table with your feet and legs supported as in a pelvic exam.  Your health care provider will insert an instrument (speculum) into your vagina to see your cervix.  Your cervix will be cleansed with an antiseptic solution. A medicine (local anesthetic) will be used to numb the cervix.  A forceps  instrument (tenaculum) will be used to hold your cervix steady for the biopsy.  A thin, rodlike instrument (uterine sound) will be inserted through your cervix to determine the length of your uterus and the location where the biopsy sample will be removed.  A thin, flexible tube (catheter) will be inserted through your cervix and into the uterus. The catheter is used to collect the biopsy sample from your endometrial tissue.  The catheter and speculum will then be removed, and the tissue sample will be sent to a lab for examination. AFTER THE PROCEDURE  You will rest in a recovery area until you are ready to go home.  You may have mild cramping and a small amount of vaginal bleeding for a few days after the procedure. This is normal.  Make sure you find out how to get your test results. Document Released: 06/30/2004 Document Revised: 10/30/2012 Document Reviewed: 08/14/2012 [facility] Patient Information 2015 [place], [place]. This information is not intended to replace advice given to you by your health care provider. Make sure you discuss any questions you have with your health care provider.  Fibroids Fibroids are lumps (tumors) that can occur any place in a woman's body. These lumps are not cancerous. Fibroids vary in size, weight, and where they grow. HOME CARE  Do not take aspirin.  Write down the number of pads or tampons you use during your period. Tell your doctor. This can help determine the best treatment for you. GET HELP RIGHT AWAY IF:  You have pain in your lower belly (abdomen) that is  not helped with medicine.  You have cramps that are not helped with medicine.  You have more bleeding between or during your period.  You feel lightheaded or pass out (faint).  Your lower belly pain gets worse. MAKE SURE YOU:  Understand these instructions.  Will watch your condition.  Will get help right away if you are not doing well or get worse. Document Released: 04/01/2010  Document Revised: 05/22/2011 Document Reviewed: 04/01/2010 [facility] Patient Information 2015 [place], [place]. This information is not intended to replace advice given to you by your health care provider. Make sure you discuss any questions you have with your health care provider.

## 2014-10-15 NOTE — Progress Notes (Signed)
Subjective  44 y.o. G24P1003  female here for pelvic ultrasound for enlarged uterus on routine pelvic exam.  Uterus 14 - 16 week size at annual exam in February 2016.  Known history of fibroids for about 13 years.  Some bladder pressure and low back ache.   Has son and 2 step daughters.   Menses - very heavy with clotting.  Menses can last for a week. Can have spotting in between cycles 3 time a year.  Painful first day.    Difficult to make through the day.  Pamprin not necessarily helpful.   Hx of Mirena IUD use.  Removed in November 2014.  Bleeding worse since IUD removed.   Contraception - none.  Sexually active - yes. Desire for future pregnancy - not trying but would welcome pregnancy.   Used Depo in past and had amenorrhea but weight gain.  Took OCPs in past without problems.   Hx of prior anemia.  Normal TSH.   Objective  Pelvic ultrasound images and report reviewed with patient.  Uterus - 3 fibroids with largest 5.5 cm and partially submucosal. Others are intramural.  EMS - 7.88 mm.  Ovaries - normal. Right hydrosalpinx 19 x 7 x 9 mm. Free fluid - no        Assessment  Uterine fibroids.  Menorrhagia with irregular cycles.  Dysmenorrhea.  Right hydrosalpinx. Hx anemia.   Plan  Discussion of fibroids - etiology and treatment options. - pills, NuvaRing, Ortho Evra, Depo Provera, uterine artery embolization, hysterectomy.  Will need to do EMB to prior to initiating treatment.  Will precert and return for this.  Procedure explained.  Discussion of dysmenorrhea and how the uterine artery embolization will not treat pain.  Does not desire this.  Discussion of hydrosalpinx.  Will do repeat labs at visit for EMB.  __25_____ minutes face to face time of which over 50% was spent in counseling.   After visit summary to patient.

## 2014-10-15 NOTE — Addendum Note (Signed)
Addended by: Michele Mcalpine on: 10/15/2014 08:10 AM   Modules accepted: Orders

## 2014-11-05 ENCOUNTER — Telehealth: Payer: Self-pay | Admitting: Obstetrics and Gynecology

## 2014-11-05 NOTE — Telephone Encounter (Signed)
Please keep in work que for endometrial biopsy for next month.  Thank you,

## 2014-11-05 NOTE — Telephone Encounter (Signed)
Call to patient to review benefits for procedure. Left voicemail for patient to return my call.

## 2014-11-05 NOTE — Telephone Encounter (Signed)
Patient called. Advised of benefits. Patient declines to schedule at this time. She will call back in a month to schedule biopsy.   Routing to provider for final review.

## 2014-11-12 NOTE — Telephone Encounter (Signed)
Noted message below from Dr. Quincy Simmonds, will keep in WQ and follow up 1 month.   enocunter closed.

## 2015-02-09 ENCOUNTER — Telehealth: Payer: Self-pay | Admitting: Obstetrics and Gynecology

## 2015-02-09 NOTE — Telephone Encounter (Signed)
Attempted call to patient to review updated insurance information and past procedure order. Unable to leave message - "the voicemail box you are calling is full".

## 2015-04-23 ENCOUNTER — Ambulatory Visit: Payer: BLUE CROSS/BLUE SHIELD | Admitting: Certified Nurse Midwife

## 2015-05-05 ENCOUNTER — Ambulatory Visit: Payer: BLUE CROSS/BLUE SHIELD | Admitting: Certified Nurse Midwife

## 2015-05-05 ENCOUNTER — Telehealth: Payer: Self-pay | Admitting: Certified Nurse Midwife

## 2015-05-05 NOTE — Telephone Encounter (Signed)
pt canceled appt for today. Her [place] [address] on way to her appt. Rescheduled appt to 05/18/15 with DL.

## 2015-05-18 ENCOUNTER — Ambulatory Visit: Payer: BLUE CROSS/BLUE SHIELD

## 2015-05-18 ENCOUNTER — Other Ambulatory Visit: Payer: Self-pay | Admitting: Certified Nurse Midwife

## 2015-05-18 ENCOUNTER — Ambulatory Visit (INDEPENDENT_AMBULATORY_CARE_PROVIDER_SITE_OTHER): Payer: BLUE CROSS/BLUE SHIELD | Admitting: Certified Nurse Midwife

## 2015-05-18 ENCOUNTER — Encounter: Payer: Self-pay | Admitting: Certified Nurse Midwife

## 2015-05-18 VITALS — BP 110/70 | HR 70 | Resp 16 | Ht 65.0 in | Wt 168.0 lb

## 2015-05-18 DIAGNOSIS — Z01419 Encounter for gynecological examination (general) (routine) without abnormal findings: Secondary | ICD-10-CM | POA: Diagnosis not present

## 2015-05-18 DIAGNOSIS — Z23 Encounter for immunization: Secondary | ICD-10-CM | POA: Diagnosis not present

## 2015-05-18 DIAGNOSIS — N920 Excessive and frequent menstruation with regular cycle: Secondary | ICD-10-CM

## 2015-05-18 DIAGNOSIS — Z862 Personal history of diseases of the blood and blood-forming organs and certain disorders involving the immune mechanism: Secondary | ICD-10-CM | POA: Diagnosis not present

## 2015-05-18 DIAGNOSIS — Z1231 Encounter for screening mammogram for malignant neoplasm of breast: Secondary | ICD-10-CM

## 2015-05-18 DIAGNOSIS — N852 Hypertrophy of uterus: Secondary | ICD-10-CM

## 2015-05-18 DIAGNOSIS — Z0000 Encounter for general adult medical examination without abnormal findings: Secondary | ICD-10-CM

## 2015-05-18 LAB — CBC
HEMATOCRIT: 37.2 % (ref 36.0–46.0)
HEMOGLOBIN: 11.7 g/dL — AB (ref 12.0–15.0)
MCH: 29.9 pg (ref 26.0–34.0)
MCHC: 31.5 g/dL (ref 30.0–36.0)
MCV: 95.1 fL (ref 78.0–100.0)
MPV: 9.7 fL (ref 8.6–12.4)
Platelets: 289 10*3/uL (ref 150–400)
RBC: 3.91 MIL/uL (ref 3.87–5.11)
RDW: 15.2 % (ref 11.5–15.5)
WBC: 4.4 10*3/uL (ref 4.0–10.5)

## 2015-05-18 LAB — IBC PANEL
%SAT: 22 % (ref 11–50)
TIBC: 342 ug/dL (ref 250–450)
UIBC: 267 ug/dL (ref 125–400)

## 2015-05-18 LAB — TSH: TSH: 2 m[IU]/L

## 2015-05-18 LAB — IRON: Iron: 75 ug/dL (ref 40–190)

## 2015-05-18 NOTE — Progress Notes (Signed)
45 y.o. G1P1002 Married  African American Fe here for annual exam.  Periods continue to be very heavy with clotting with heavy day 1-4 and duration 7 days.Continues to have some cramping first 2 days, uses Rx with good relief. Desires to have endo biopsy as discussed. Would like to set up today if possible. Also requests help scheduling mammogram. Sees Dr. Ardeth Perfect yearly for aex and labs. Had iron infusion last year due to anemia. Does not feel uterus has increased in size, but bleeding has continued same. No other health issues today.  Patient's last menstrual period was 05/17/2015.          Sexually active: Yes.    The current method of family planning is none.    Exercising: No.  exercise Smoker:  no  Health Maintenance: Pap:  04-20-14 neg HPV HR neg MMG:  2014 per patient neg Colonoscopy:  none BMD:   none TDaP:  Maybe over 10?   Shingles: no Pneumonia: no Hep C and HIV: HIV done neg  Labs: hgb-11.3 Self breast exam: done occ   reports that she has never smoked. She has never used smokeless tobacco. She reports that she drinks about 1.2 oz of alcohol per week. She reports that she does not use illicit drugs.  Past Medical History  Diagnosis Date  . Fibroid     Past Surgical History  Procedure Laterality Date  . Mirena      removed 2013    Current Outpatient Prescriptions  Medication Sig Dispense Refill  . Cholecalciferol (VITAMIN D3) 5000 UNITS TABS Take 1 tablet by mouth daily.    Marland Kitchen ibuprofen (ADVIL,MOTRIN) 800 MG tablet Take 1 tablet (800 mg total) by mouth every 8 (eight) hours as needed. 30 tablet 2  . traZODone (DESYREL) 50 MG tablet Take 50 mg by mouth at bedtime.  1   No current facility-administered medications for this visit.    Family History  Problem Relation Age of Onset  . Heart disease Mother     CHF  . Hypertension Mother   . Diabetes Father   . Sarcoidosis Sister 41    deceased as complication of  . Heart attack Maternal Uncle   . Breast cancer  Maternal Grandmother 2     ? death from breast cancer  . Hypertension Maternal Grandmother     ROS:  Pertinent items are noted in HPI.  Otherwise, a comprehensive ROS was negative.  Exam:   BP 110/70 mmHg  Pulse 70  Resp 16  Ht 5' 5"  (1.651 m)  Wt 168 lb (76.204 kg)  BMI 27.96 kg/m2  LMP 05/17/2015 Height: 5' 5"  (165.1 cm) Ht Readings from Last 3 Encounters:  05/18/15 5' 5"  (1.651 m)  10/15/14 5' 4.75" (1.645 m)  08/31/14 5' 5"  (1.651 m)    General appearance: alert, cooperative and appears stated age Head: Normocephalic, without obvious abnormality, atraumatic Neck: no adenopathy, supple, symmetrical, trachea midline and thyroid normal to inspection and palpation Lungs: clear to auscultation bilaterally Breasts: normal appearance, no masses or tenderness, No nipple retraction or dimpling, No nipple discharge or bleeding, No axillary or supraclavicular adenopathy Heart: regular rate and rhythm Abdomen: soft, non-tender; no masses,  no organomegaly Extremities: extremities normal, atraumatic, no cyanosis or edema Skin: Skin color, texture, turgor normal. No rashes or lesions Lymph nodes: Cervical, supraclavicular, and axillary nodes normal. No abnormal inguinal nodes palpated Neurologic: Grossly normal   Pelvic: External genitalia:  no lesions  Urethra:  normal appearing urethra with no masses, tenderness or lesions              Bartholin's and Skene's: normal                 Vagina: normal appearing vagina with normal color and discharge, no lesions, heavy blood present             Cervix: normal,non tender, no lesions, blood noted from cervix.              Pap taken: No. Bimanual Exam:  Uterus:  enlarged, 14-16 weeks size weeks size              Adnexa: no mass, fullness, tenderness and adnexal  not palpated               Rectovaginal: Confirms               Anus:  normal sphincter tone, no lesions  Chaperone present: yes  A:  Well Woman with normal  exam  Menorrhagia history with enlarged uterus due to  Fibroid history, no significant change in uterine size.  Endometrial biopsy recommended previously and patient would like to schedule.  Immunization due  Mammogram due will schedule for patient prior to leaving today.  Screening labs    P:   Reviewed health and wellness pertinent to exam  Discussed warning signs with heavy cycles and need to advise.  Discussed scheduling endometrial biopsy and will work with [employer] and scheduling while she is here today. Patient happy she can now have done.  Aware of enlarged uterus from previous exam and no significant size change noted. Patient to advise if any symptoms change.  Requests TDAP  Mammogram scheduled and given information on appointment.  Labs: TSH,Vitamin D, CBC, IBC, Iron  Pap smear as above not taken   counseled on breast self exam, mammography screening, adequate intake of calcium and vitamin D, diet and exercise. Given handout on menorrhagia expectations and concerns.  return annually or prn  An After Visit Summary was printed and given to the patient.

## 2015-05-18 NOTE — Patient Instructions (Signed)
EXERCISE AND DIET:  We recommended that you start or continue a regular exercise program for good health. Regular exercise means any activity that makes your heart beat faster and makes you sweat.  We recommend exercising at least 30 minutes per day at least 3 days a week, preferably 4 or 5.  We also recommend a diet low in fat and sugar.  Inactivity, poor dietary choices and obesity can cause diabetes, heart attack, stroke, and kidney damage, among others.    ALCOHOL AND SMOKING:  Women should limit their alcohol intake to no more than 7 drinks/beers/glasses of wine (combined, not each!) per week. Moderation of alcohol intake to this level decreases your risk of breast cancer and liver damage. And of course, no recreational drugs are part of a healthy lifestyle.  And absolutely no smoking or even second hand smoke. Most people know smoking can cause heart and lung diseases, but did you know it also contributes to weakening of your bones? Aging of your skin?  Yellowing of your teeth and nails?  CALCIUM AND VITAMIN D:  Adequate intake of calcium and Vitamin D are recommended.  The recommendations for exact amounts of these supplements seem to change often, but generally speaking 600 mg of calcium (either carbonate or citrate) and 800 units of Vitamin D per day seems prudent. Certain women may benefit from higher intake of Vitamin D.  If you are among these women, your doctor will have told you during your visit.    PAP SMEARS:  Pap smears, to check for cervical cancer or precancers,  have traditionally been done yearly, although recent scientific advances have shown that most women can have pap smears less often.  However, every woman still should have a physical exam from her gynecologist every year. It will include a breast check, inspection of the vulva and vagina to check for abnormal growths or skin changes, a visual exam of the cervix, and then an exam to evaluate the size and shape of the uterus and  ovaries.  And after 44 years of age, a rectal exam is indicated to check for rectal cancers. We will also provide age appropriate advice regarding health maintenance, like when you should have certain vaccines, screening for sexually transmitted diseases, bone density testing, colonoscopy, mammograms, etc.   MAMMOGRAMS:  All women over 40 years old should have a yearly mammogram. Many facilities now offer a "3D" mammogram, which may cost around $50 extra out of pocket. If possible,  we recommend you accept the option to have the 3D mammogram performed.  It both reduces the number of women who will be called back for extra views which then turn out to be normal, and it is better than the routine mammogram at detecting truly abnormal areas.    COLONOSCOPY:  Colonoscopy to screen for colon cancer is recommended for all women at age 50.  We know, you hate the idea of the prep.  We agree, BUT, having colon cancer and not knowing it is worse!!  Colon cancer so often starts as a polyp that can be seen and removed at colonscopy, which can quite literally save your life!  And if your first colonoscopy is normal and you have no family history of colon cancer, most women don't have to have it again for 10 years.  Once every ten years, you can do something that may end up saving your life, right?  We will be happy to help you get it scheduled when you are ready.    Be sure to check your insurance coverage so you understand how much it will cost.  It may be covered as a preventative service at no cost, but you should check your particular policy.     Menorrhagia Menorrhagia is when your menstrual periods are heavy or last longer than usual.  HOME CARE  Only take medicine as told by your doctor.  Take any iron pills as told by your doctor. Heavy bleeding may cause low levels of iron in your body.  Do not take aspirin 1 week before or during your period. Aspirin can make the bleeding worse.  Lie down for a while if  you change your tampon or pad more than once in 2 hours. This may help lessen the bleeding.  Eat a healthy diet and foods with iron. These foods include leafy green vegetables, meat, liver, eggs, and whole grain breads and cereals.  Do not try to lose weight. Wait until the heavy bleeding has stopped and your iron level is normal. GET HELP IF:  You soak through a pad or tampon every 1 or 2 hours, and this happens every time you have a period.  You need to use pads and tampons at the same time because you are bleeding so much.  You need to change your pad or tampon during the night.  You have a period that lasts for more than 8 days.  You pass clots bigger than 1 inch (2.5 cm) wide.  You have irregular periods that happen more or less often than once a month.  You feel dizzy or pass out (faint).  You feel very weak or tired.  You feel short of breath or feel your heart is beating too fast when you exercise.  You feel sick to your stomach (nausea) and you throw up (vomit) while you are taking your medicine.   You have watery poop (diarrhea) while you are taking your medicine.  You have any problems that may be related to the medicine you are taking.  GET HELP RIGHT AWAY IF:  You soak through 4 or more pads or tampons in 2 hours.  You have any bleeding while you are pregnant. MAKE SURE YOU:   Understand these instructions.  Will watch your condition.  Will get help right away if you are not doing well or get worse.   This information is not intended to replace advice given to you by your health care provider. Make sure you discuss any questions you have with your health care provider.   Document Released: 12/07/2007 Document Revised: 10/30/2012 Document Reviewed: 08/29/2012 Elsevier Interactive Patient Education 2016 Elsevier Inc[employer] Uterine Fibroids Uterine fibroids are tissue masses (tumors) that can develop in the womb (uterus). They are also called leiomyomas. This  type of tumor is not cancerous (benign) and does not spread to other parts of the body outside of the pelvic area, which is between the hip bones. Occasionally, fibroids may develop in the fallopian tubes, in the cervix, or on the support structures (ligaments) that surround the uterus. You can have one or many fibroids. Fibroids can vary in size, weight, and where they grow in the uterus. Some can become quite large. Most fibroids do not require medical treatment. CAUSES A fibroid can develop when a single uterine cell keeps growing (replicating). Most cells in the human body have a control mechanism that keeps them from replicating without control. SIGNS AND SYMPTOMS Symptoms may include:   Heavy bleeding during your period.  Bleeding or spotting  between periods.  Pelvic pain and pressure.  Bladder problems, such as needing to urinate more often (urinary frequency) or urgently.  Inability to reproduce offspring (infertility).  Miscarriages. DIAGNOSIS Uterine fibroids are diagnosed through a physical exam. Your health care provider may feel the lumpy tumors during a pelvic exam. Ultrasonography and an MRI may be done to determine the size, location, and number of fibroids. TREATMENT Treatment may include:  Watchful waiting. This involves getting the fibroid checked by your health care provider to see if it grows or shrinks. Follow your health care provider's recommendations for how often to have this checked.  Hormone medicines. These can be taken by mouth or given through an intrauterine device (IUD).  Surgery.  Removing the fibroids (myomectomy) or the uterus (hysterectomy).  Removing blood supply to the fibroids (uterine artery embolization). If fibroids interfere with your fertility and you want to become pregnant, your health care provider may recommend having the fibroids removed.  HOME CARE INSTRUCTIONS  Keep all follow-up visits as directed by your health care provider.  This is important.  Take medicines only as directed by your health care provider.  If you were prescribed a hormone treatment, take the hormone medicines exactly as directed.  Do not take aspirin, because it can cause bleeding.  Ask your health care provider about taking iron pills and increasing the amount of dark green, leafy vegetables in your diet. These actions can help to boost your blood iron levels, which may be affected by heavy menstrual bleeding.  Pay close attention to your period and tell your health care provider about any changes, such as:  Increased blood flow that requires you to use more pads or tampons than usual per month.  A change in the number of days that your period lasts per month.  A change in symptoms that are associated with your period, such as abdominal cramping or back pain. SEEK MEDICAL CARE IF:  You have pelvic pain, back pain, or abdominal cramps that cannot be controlled with medicines.  You have an increase in bleeding between and during periods.  You soak tampons or pads in a half hour or less.  You feel lightheaded, extra tired, or weak. SEEK IMMEDIATE MEDICAL CARE IF:  You faint.  You have a sudden increase in pelvic pain.   This information is not intended to replace advice given to you by your health care provider. Make sure you discuss any questions you have with your health care provider.   Document Released: 02/25/2000 Document Revised: 03/20/2014 Document Reviewed: 08/26/2013 Elsevier Interactive Patient Education [employer].

## 2015-05-19 LAB — VITAMIN D 25 HYDROXY (VIT D DEFICIENCY, FRACTURES): Vit D, 25-Hydroxy: 32 ng/mL (ref 30–100)

## 2015-05-20 NOTE — Progress Notes (Signed)
Encounter reviewed Isabele Lollar, MD   

## 2015-05-21 LAB — HEMOGLOBIN, FINGERSTICK: Hemoglobin, fingerstick: 11.3 g/dL — ABNORMAL LOW (ref 12.0–16.0)

## 2015-05-24 ENCOUNTER — Encounter: Payer: Self-pay | Admitting: Obstetrics and Gynecology

## 2015-05-24 ENCOUNTER — Ambulatory Visit (INDEPENDENT_AMBULATORY_CARE_PROVIDER_SITE_OTHER): Payer: BLUE CROSS/BLUE SHIELD | Admitting: Obstetrics and Gynecology

## 2015-05-24 VITALS — BP 110/76 | HR 70 | Resp 18 | Ht 65.0 in | Wt 168.0 lb

## 2015-05-24 DIAGNOSIS — D259 Leiomyoma of uterus, unspecified: Secondary | ICD-10-CM

## 2015-05-24 DIAGNOSIS — N921 Excessive and frequent menstruation with irregular cycle: Secondary | ICD-10-CM | POA: Diagnosis not present

## 2015-05-24 DIAGNOSIS — Z319 Encounter for procreative management, unspecified: Secondary | ICD-10-CM | POA: Diagnosis not present

## 2015-05-24 LAB — POCT URINE PREGNANCY: Preg Test, Ur: NEGATIVE

## 2015-05-24 NOTE — Patient Instructions (Signed)
Endometrial Biopsy, Care After Refer to this sheet in the next few weeks. These instructions provide you with information on caring for yourself after your procedure. Your health care provider may also give you more specific instructions. Your treatment has been planned according to current medical practices, but problems sometimes occur. Call your health care provider if you have any problems or questions after your procedure. WHAT TO EXPECT AFTER THE PROCEDURE After your procedure, it is typical to have the following:  You may have mild cramping and a small amount of vaginal bleeding for a few days after the procedure. This is normal. HOME CARE INSTRUCTIONS  Only take over-the-counter or prescription medicine as directed by your health care provider.  Do not douche, use tampons, or have sexual intercourse until your health care provider approves.  Follow your health care provider's instructions regarding any activity restrictions, such as strenuous exercise or heavy lifting. SEEK MEDICAL CARE IF:  You have heavy bleeding or bleeding longer than 2 days after the procedure.  You have bad smelling drainage from your vagina.  You have a fever and chills.  Youhave severe lower stomach (abdominal) pain. SEEK IMMEDIATE MEDICAL CARE IF:  You have severe cramps in your stomach or back.  You pass large blood clots.  Your bleeding increases.  You become weak or lightheaded, or you pass out.   This information is not intended to replace advice given to you by your health care provider. Make sure you discuss any questions you have with your health care provider.   Document Released: 12/18/2012 Document Reviewed: 12/18/2012 Elsevier Interactive Patient Education [employer].

## 2015-05-24 NOTE — Progress Notes (Signed)
Patient ID: Kathy Craig, female   DOB: 10-30-1970, 45 y.o.   MRN: [id number] GYNECOLOGY  VISIT   HPI: 45 y.o.   Married  [place] American  female   [phone] with Patient's last menstrual period was 05/17/2015 (exact date).   here for endometrial biopsy.  Has heavy menstrual cycles and anemia.  Known uterine enlargement with fibroids.  Hx of Mirena IUD use.  Removed in November 2014.  Bleeding worse since IUD removed.  Used Depo in past and had amenorrhea but weight gain.  Took OCPs in past without problems.    Currently sexually active and not using contraception. Desire for future pregnancy - not trying but would welcome pregnancy.   Pelvic ultrasound in office 10/15/14: Uterus - 3 fibroids with largest 5.5 cm and partially submucosal. Others are intramural.  EMS - 7.88 mm.  Ovaries - normal. Right hydrosalpinx 19 x 7 x 9 mm. Free fluid - no  UPT today - negative.   GYNECOLOGIC HISTORY: Patient's last menstrual period was 05/17/2015 (exact date). Contraception:None.  Sexually active with husband.  Would like to pursue pregnancy.  Has a son who is 44 yo.  Menopausal hormone therapy: n/a Last mammogram: 2014 normal per patient. Has appointment 06-04-15 at [facility] Last pap smear: 04-20-14 Neg:Neg HR HPv        OB History    Gravida Para Term Preterm AB TAB SAB Ectopic Multiple Living   3 1 1  2 2    1          There are no active problems to display for this patient.   Past Medical History  Diagnosis Date  . Hydrosalpinx 10/15/14    right  . Fibroid     5.5 cm possible partially submucous fibroid    Past Surgical History  Procedure Laterality Date  . Mirena      removed 2013    Current Outpatient Prescriptions  Medication Sig Dispense Refill  . Cholecalciferol (VITAMIN D3) 5000 UNITS TABS Take 1 tablet by mouth daily.    Marland Kitchen ibuprofen (ADVIL,MOTRIN) 800 MG tablet Take 1 tablet (800 mg total) by mouth every 8 (eight) hours as needed. 30 tablet 2  .  traZODone (DESYREL) 50 MG tablet Take 50 mg by mouth at bedtime.  1   No current facility-administered medications for this visit.     ALLERGIES: Review of patient's allergies indicates no known allergies.  Family History  Problem Relation Age of Onset  . Heart disease Mother     CHF  . Hypertension Mother   . Diabetes Father   . Sarcoidosis Sister 38    deceased as complication of  . Heart attack Maternal Uncle   . Breast cancer Maternal Grandmother 49     ? death from breast cancer  . Hypertension Maternal Grandmother     Social History   Social History  . Marital Status: Married    Spouse Name: N/A  . Number of Children: N/A  . Years of Education: N/A   Occupational History  . Not on file.   Social History Main Topics  . Smoking status: Never Smoker   . Smokeless tobacco: Never Used  . Alcohol Use: 1.2 oz/week    2 Standard drinks or equivalent per week  . Drug Use: No  . Sexual Activity:    Partners: Male    Birth Control/ Protection: None   Other Topics Concern  . Not on file   Social History Narrative    ROS:  Pertinent items are noted in HPI.  PHYSICAL EXAMINATION:    BP 110/76 mmHg  Pulse 70  Resp 18  Ht 5' 5"  (1.651 m)  Wt 168 lb (76.204 kg)  BMI 27.96 kg/m2  LMP 05/17/2015 (Exact Date)    General appearance: alert, cooperative and appears stated age   Pelvic: External genitalia:  no lesions              Urethra:  normal appearing urethra with no masses, tenderness or lesions              Bartholins and Skenes: normal                 Vagina: normal appearing vagina with normal color and discharge, no lesions              Cervix: no lesions                Bimanual Exam:  Uterus:  enlarged,  14 weeks size              Adnexa: normal adnexa and no mass, fullness, tenderness   Procedure - endometrial biopsy Consent performed. Speculum place in vagina.  Sterile prep of cervix with Hibiclens. Tenaculum to anterior cervical  lip. Paracervical block with 10 cc 1% lidocaine - lot number [id number], exp 05/11/16 Pipelle placed to     9     cm without difficulty twice. Tissue obtained and sent to pathology. Speculum removed.  No complications. Minimal EBL.  Chaperone was present for exam.  ASSESSMENT  Menorrhagia.  Fibroids.  One large fibroid may be partially submucosal. Right hydrosalpinx. Desire for future pregnancy.  AMA status.   PLAN  Counseled regarding menorrhagia and fibroids.  Follow up EMB. Instructions and precautions given.  Counseled regarding AMA status and decreased fertility, increased risk of pregnancy loss, gestational diabetes, HTN, and aneuploidy risk. Will check AMH level.  Plan for fibroids and fertility care to follow.  May need myomectomy and tubal surgery if pursuing pregnancy. We discussed referral to infertility specialist.   An After Visit Summary was printed and given to the patient.  ___15__ minutes face to face time of which over 50% was spent in counseling regarding fibroids, menorrhagia, and fertility status and planning.

## 2015-05-26 LAB — IPS OTHER TISSUE BIOPSY

## 2015-05-27 LAB — ANTI MULLERIAN HORMONE: AMH AssessR: 0.61 ng/mL

## 2015-06-02 ENCOUNTER — Telehealth: Payer: Self-pay | Admitting: *Deleted

## 2015-06-02 NOTE — Telephone Encounter (Signed)
-----   Message from Nunzio Cobbs, MD sent at 05/31/2015  7:35 PM EDT ----- Please inform patient of benign endometrial biopsy.   Please let me know if she would like to proceed with a treatment option for her bleeding and fibroids or with a referral to a fertility specialist, as she and I discussed.  Her AMH was low, consistent with potential decreased fertility expected at her current age.  This result was shared with her through My Chart.    I am happy to meet with the patient again if this would be helpful.  [place], Marisa Sprinkles

## 2015-06-02 NOTE — Telephone Encounter (Signed)
Call to patient. Notified of benign endometrial biopsy and low AMH level as instructed by Dr Quincy Simmonds. Instructed to call if desires treatment for bleeding/fibroids or desires referral to fertility specialist. Advised can schedule additional consult if patient would like to discuss further with Dr Quincy Simmonds. Patient states she will discuss with husband when she gets home and will call us back with decision.

## 2015-06-04 ENCOUNTER — Ambulatory Visit: Payer: BLUE CROSS/BLUE SHIELD

## 2015-06-07 NOTE — Telephone Encounter (Signed)
Call to patient. Following up to see if patient desires any follow-up after receiving lab results last week. Patient states she and her husband have not had time to discuss and make decisions. She will call back if she desires any assistance.  Routing to provider for final review. Patient agreeable to disposition. Will close encounter.

## 2015-07-01 ENCOUNTER — Ambulatory Visit: Payer: BLUE CROSS/BLUE SHIELD

## 2015-11-17 ENCOUNTER — Other Ambulatory Visit: Payer: Self-pay | Admitting: Obstetrics and Gynecology

## 2015-11-18 NOTE — Telephone Encounter (Signed)
Medication refill request: Ibuprofen  Last AEX:  05/18/15/ DL  Next AEX: 05/18/16 DL Last MMG (if hormonal medication request): n/a Refill authorized: 10/15/14 #30 2R. Please advise. Thank you.

## 2016-03-10 ENCOUNTER — Other Ambulatory Visit: Payer: Self-pay | Admitting: Certified Nurse Midwife

## 2016-03-14 NOTE — Telephone Encounter (Signed)
Medication refill request: Ibuprofen  Last AEX:  05/18/15 DL Next AEX: 05/18/16 DL Last MMG (if hormonal medication request): n/a Refill authorized: 11/18/15 #30 0R. Please advise. Thank you.

## 2016-05-18 ENCOUNTER — Ambulatory Visit: Payer: BLUE CROSS/BLUE SHIELD | Admitting: Certified Nurse Midwife

## 2016-06-14 ENCOUNTER — Ambulatory Visit: Payer: BLUE CROSS/BLUE SHIELD | Admitting: Certified Nurse Midwife

## 2016-06-14 ENCOUNTER — Encounter: Payer: Self-pay | Admitting: Certified Nurse Midwife

## 2016-06-14 NOTE — Progress Notes (Deleted)
46 y.o. [id number] Married  {Race/ethnicity:17218} Fe here for annual exam.    No LMP recorded.          Sexually active: {yes no:314532}  The current method of family planning is {contraception:315051}.    Exercising: {yes no:314532}  {types:19826} Smoker:  {YES NO:22349}  Health Maintenance: Pap:  04-20-14 neg HPV HR neg MMG: 2014 neg per patient Colonoscopy:  none BMD:   none TDaP:  2017 Shingles: *** Pneumonia: *** Hep C and HIV: HIV neg in the past Labs: *** Self breast exam:   reports that she has never smoked. She has never used smokeless tobacco. She reports that she drinks about 1.2 oz of alcohol per week . She reports that she does not use drugs.  Past Medical History:  Diagnosis Date  . Fibroid    5.5 cm possible partially submucous fibroid  . Hydrosalpinx 10/15/14   right    Past Surgical History:  Procedure Laterality Date  . mirena     removed 2013    Current Outpatient Prescriptions  Medication Sig Dispense Refill  . Cholecalciferol (VITAMIN D3) 5000 UNITS TABS Take 1 tablet by mouth daily.    Marland Kitchen ibuprofen (ADVIL,MOTRIN) 800 MG tablet TAKE 1 TABLET BY MOUTH EVERY 8 HOURS AS NEEDED 30 tablet 0  . traZODone (DESYREL) 50 MG tablet Take 50 mg by mouth at bedtime.  1   No current facility-administered medications for this visit.     Family History  Problem Relation Age of Onset  . Heart disease Mother     CHF  . Hypertension Mother   . Diabetes Father   . Sarcoidosis Sister 35    deceased as complication of  . Heart attack Maternal Uncle   . Breast cancer Maternal Grandmother 53     ? death from breast cancer  . Hypertension Maternal Grandmother     ROS:  Pertinent items are noted in HPI.  Otherwise, a comprehensive ROS was negative.  Exam:   There were no vitals taken for this visit.   Ht Readings from Last 3 Encounters:  05/24/15 5' 5"  (1.651 m)  05/18/15 5' 5"  (1.651 m)  10/15/14 5' 4.75" (1.645 m)    General appearance: alert, cooperative and  appears stated age Head: Normocephalic, without obvious abnormality, atraumatic Neck: no adenopathy, supple, symmetrical, trachea midline and thyroid {EXAM; THYROID:18604} Lungs: clear to auscultation bilaterally Breasts: {Exam; breast:13139::"normal appearance, no masses or tenderness"} Heart: regular rate and rhythm Abdomen: soft, non-tender; no masses,  no organomegaly Extremities: extremities normal, atraumatic, no cyanosis or edema Skin: Skin color, texture, turgor normal. No rashes or lesions Lymph nodes: Cervical, supraclavicular, and axillary nodes normal. No abnormal inguinal nodes palpated Neurologic: Grossly normal   Pelvic: External genitalia:  no lesions              Urethra:  normal appearing urethra with no masses, tenderness or lesions              Bartholin's and Skene's: normal                 Vagina: normal appearing vagina with normal color and discharge, no lesions              Cervix: {exam; cervix:14595}              Pap taken: {yes no:314532} Bimanual Exam:  Uterus:  {exam; uterus:12215}              Adnexa: {exam; adnexa:12223}  Rectovaginal: Confirms               Anus:  normal sphincter tone, no lesions  Chaperone present: ***  A:  Well Woman with normal exam  P:   Reviewed health and wellness pertinent to exam  Pap smear as above  {plan; gyn:5269::"mammogram","pap smear","return annually or prn"}  An After Visit Summary was printed and given to the patient.

## 2016-06-23 ENCOUNTER — Ambulatory Visit (INDEPENDENT_AMBULATORY_CARE_PROVIDER_SITE_OTHER): Payer: BLUE CROSS/BLUE SHIELD | Admitting: Certified Nurse Midwife

## 2016-06-23 ENCOUNTER — Other Ambulatory Visit: Payer: Self-pay | Admitting: Certified Nurse Midwife

## 2016-06-23 ENCOUNTER — Encounter: Payer: Self-pay | Admitting: Certified Nurse Midwife

## 2016-06-23 VITALS — BP 100/72 | HR 68 | Resp 16 | Ht 65.75 in | Wt 171.0 lb

## 2016-06-23 DIAGNOSIS — D259 Leiomyoma of uterus, unspecified: Secondary | ICD-10-CM | POA: Diagnosis not present

## 2016-06-23 DIAGNOSIS — Z862 Personal history of diseases of the blood and blood-forming organs and certain disorders involving the immune mechanism: Secondary | ICD-10-CM | POA: Diagnosis not present

## 2016-06-23 DIAGNOSIS — Z0000 Encounter for general adult medical examination without abnormal findings: Secondary | ICD-10-CM

## 2016-06-23 DIAGNOSIS — Z124 Encounter for screening for malignant neoplasm of cervix: Secondary | ICD-10-CM | POA: Diagnosis not present

## 2016-06-23 DIAGNOSIS — Z1231 Encounter for screening mammogram for malignant neoplasm of breast: Secondary | ICD-10-CM

## 2016-06-23 DIAGNOSIS — N852 Hypertrophy of uterus: Secondary | ICD-10-CM

## 2016-06-23 DIAGNOSIS — Z01419 Encounter for gynecological examination (general) (routine) without abnormal findings: Secondary | ICD-10-CM | POA: Diagnosis not present

## 2016-06-23 LAB — CBC
HEMATOCRIT: 33.9 % — AB (ref 35.0–45.0)
Hemoglobin: 11 g/dL — ABNORMAL LOW (ref 11.7–15.5)
MCH: 30.6 pg (ref 27.0–33.0)
MCHC: 32.4 g/dL (ref 32.0–36.0)
MCV: 94.4 fL (ref 80.0–100.0)
MPV: 9.2 fL (ref 7.5–12.5)
Platelets: 340 10*3/uL (ref 140–400)
RBC: 3.59 MIL/uL — ABNORMAL LOW (ref 3.80–5.10)
RDW: 13.4 % (ref 11.0–15.0)
WBC: 6.8 10*3/uL (ref 3.8–10.8)

## 2016-06-23 LAB — LIPID PANEL
CHOL/HDL RATIO: 2.9 ratio (ref <5.0)
Cholesterol: 206 mg/dL — ABNORMAL HIGH (ref <200)
HDL: 70 mg/dL (ref >50)
LDL CALC: 123 mg/dL — AB (ref <100)
Triglycerides: 67 mg/dL (ref <150)
VLDL: 13 mg/dL (ref <30)

## 2016-06-23 LAB — COMPREHENSIVE METABOLIC PANEL
ALK PHOS: 58 U/L (ref 33–115)
ALT: 9 U/L (ref 6–29)
AST: 17 U/L (ref 10–35)
Albumin: 3.9 g/dL (ref 3.6–5.1)
BUN: 8 mg/dL (ref 7–25)
CHLORIDE: 101 mmol/L (ref 98–110)
CO2: 26 mmol/L (ref 20–31)
Calcium: 9.1 mg/dL (ref 8.6–10.2)
Creat: 0.58 mg/dL (ref 0.50–1.10)
Glucose, Bld: 100 mg/dL — ABNORMAL HIGH (ref 65–99)
Potassium: 4 mmol/L (ref 3.5–5.3)
SODIUM: 137 mmol/L (ref 135–146)
TOTAL PROTEIN: 7.6 g/dL (ref 6.1–8.1)
Total Bilirubin: 0.7 mg/dL (ref 0.2–1.2)

## 2016-06-23 LAB — IRON: IRON: 44 ug/dL (ref 40–190)

## 2016-06-23 LAB — IBC PANEL
%SAT: 12 % (ref 11–50)
TIBC: 357 ug/dL (ref 250–450)
UIBC: 313 ug/dL (ref 125–400)

## 2016-06-23 NOTE — Progress Notes (Signed)
46 y.o. [id number] Married  African American Fe here for annual exam. Periods normal regular monthly. Some cramping like period will started, mid-cycle. Does feel fibroids have change as far as symptoms. Periods not as heavy. Sees PCP yearly, but has not seen yet and would like to have labs here. May change from [facility] to [place]. PCP manages Trazodone. No health  Issues today. Taking vacation next week!!  Patient's last menstrual period was 06/11/2016.          Sexually active: Yes.    The current method of family planning is none.    Exercising: Yes.    The patient has a physically strenuous job, but has no regular exercise apart from work.  Smoker:  no  Health Maintenance: Pap:  04-20-14 neg HPV HR neg MMG:  2014 per patient neg Colonoscopy:  none BMD:   none TDaP:  05/18/2015 Shingles: no Pneumonia: no Hep C and HIV: years ago per patient Labs: discuss today  Self breast exam: occasionally   reports that she has never smoked. She has never used smokeless tobacco. She reports that she drinks about 1.2 oz of alcohol per week . She reports that she does not use drugs.  Past Medical History:  Diagnosis Date  . Fibroid    5.5 cm possible partially submucous fibroid  . Hydrosalpinx 10/15/14   right    Past Surgical History:  Procedure Laterality Date  . mirena     removed 2013    Current Outpatient Prescriptions  Medication Sig Dispense Refill  . ibuprofen (ADVIL,MOTRIN) 800 MG tablet TAKE 1 TABLET BY MOUTH EVERY 8 HOURS AS NEEDED 30 tablet 0  . traZODone (DESYREL) 50 MG tablet Take 50 mg by mouth at bedtime.  1  . Cholecalciferol (VITAMIN D3) 5000 UNITS TABS Take 1 tablet by mouth daily.     No current facility-administered medications for this visit.     Family History  Problem Relation Age of Onset  . Heart disease Mother     CHF  . Hypertension Mother   . Diabetes Father   . Sarcoidosis Sister 57    deceased as complication of  . Heart attack Maternal Uncle    . Breast cancer Maternal Grandmother 36     ? death from breast cancer  . Hypertension Maternal Grandmother     ROS:  Pertinent items are noted in HPI.  Otherwise, a comprehensive ROS was negative.  Exam:   BP 100/72 (BP Location: Right Arm, Patient Position: Sitting, Cuff Size: Normal)   Pulse 68   Resp 16   Ht 5' 5.75" (1.67 m)   Wt 171 lb (77.6 kg)   LMP 06/11/2016   BMI 27.81 kg/m  Height: 5' 5.75" (167 cm) Ht Readings from Last 3 Encounters:  06/23/16 5' 5.75" (1.67 m)  05/24/15 5' 5"  (1.651 m)  05/18/15 5' 5"  (1.651 m)    General appearance: alert, cooperative and appears stated age Head: Normocephalic, without obvious abnormality, atraumatic Neck: no adenopathy, supple, symmetrical, trachea midline and thyroid normal to inspection and palpation Lungs: clear to auscultation bilaterally Breasts: normal appearance, no masses or tenderness, No nipple retraction or dimpling, No nipple discharge or bleeding, No axillary or supraclavicular adenopathy Heart: regular rate and rhythm Abdomen: soft, non-tender; no masses,  no organomegaly Extremities: extremities normal, atraumatic, no cyanosis or edema Skin: Skin color, texture, turgor normal. No rashes or lesions Lymph nodes: Cervical, supraclavicular, and axillary nodes normal. No abnormal inguinal nodes palpated Neurologic: Grossly normal  Pelvic: External genitalia:  no lesions              Urethra:  normal appearing urethra with no masses, tenderness or lesions              Bartholin's and Skene's: normal                 Vagina: normal appearing vagina with normal color and discharge, no lesions              Cervix: multiparous appearance, no cervical motion tenderness, no lesions and normal appearance              Pap taken: yes Bimanual Exam:  Uterus:  Uterus enlarged approximatedly 12-14 week size history of fibroid              Adnexa: normal adnexa and no mass, fullness, tenderness               Rectovaginal:  Confirms               Anus:  normal sphincter tone, no lesions  Chaperone present: yes  A:  Well Woman with normal exam  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  History of anemia, not symptomatic today  Screening labs  Mammogram due will schedule today prior to leaving office    P:   Reviewed health and wellness pertinent to exam  Patient aware of warning signs with fibroids and bleeding pattern change and will advise if occurs  Labs: CBC, CMP, Ferritin, IBC, Iron, Lipid panel, TSh, Vit. D  Pap smear as above   counseled on breast self exam, mammography screening, adequate intake of calcium and vitamin D, diet and exercise  return annually or prn  An After Visit Summary was printed and given to the patient.

## 2016-06-23 NOTE — Patient Instructions (Signed)

## 2016-06-23 NOTE — Progress Notes (Signed)
Scheduled patient while in office for screening mammogram at the [facility] on 07/11/2016 at 3:30 pm. Patient is agreeable to date and time.

## 2016-06-24 LAB — TSH: TSH: 1.47 m[IU]/L

## 2016-06-24 LAB — FERRITIN: Ferritin: 12 ng/mL (ref 10–232)

## 2016-06-24 LAB — VITAMIN D 25 HYDROXY (VIT D DEFICIENCY, FRACTURES): Vit D, 25-Hydroxy: 29 ng/mL — ABNORMAL LOW (ref 30–100)

## 2016-06-26 LAB — IPS PAP TEST WITH REFLEX TO HPV

## 2016-06-28 ENCOUNTER — Other Ambulatory Visit: Payer: Self-pay

## 2016-06-28 DIAGNOSIS — E559 Vitamin D deficiency, unspecified: Secondary | ICD-10-CM

## 2016-06-28 LAB — HEMOGLOBIN A1C
HEMOGLOBIN A1C: 5.1 % (ref <5.7)
Mean Plasma Glucose: 100 mg/dL

## 2016-06-28 NOTE — Telephone Encounter (Signed)
lmtcb to give lab results.

## 2016-06-29 MED ORDER — VITAMIN D (ERGOCALCIFEROL) 1.25 MG (50000 UNIT) PO CAPS: 50000.0000 [IU] | ORAL_CAPSULE | ORAL | 0 refills | Status: DC

## 2016-06-29 NOTE — Telephone Encounter (Signed)
Pt notified of results & vit d 50,000iu once weekly #12/0 refills sent to pharmacy.

## 2016-07-01 NOTE — Progress Notes (Signed)
Encounter reviewed Aidyn Kellis, MD   

## 2016-07-11 ENCOUNTER — Ambulatory Visit
Admission: RE | Admit: 2016-07-11 | Discharge: 2016-07-11 | Disposition: A | Discharge status: Home / Self Care | Payer: BLUE CROSS/BLUE SHIELD | Source: Ambulatory Visit | Attending: Certified Nurse Midwife | Admitting: Certified Nurse Midwife

## 2016-07-11 DIAGNOSIS — Z1231 Encounter for screening mammogram for malignant neoplasm of breast: Secondary | ICD-10-CM

## 2016-07-26 ENCOUNTER — Encounter: Payer: Self-pay | Admitting: Certified Nurse Midwife

## 2016-07-26 ENCOUNTER — Ambulatory Visit (INDEPENDENT_AMBULATORY_CARE_PROVIDER_SITE_OTHER): Payer: BLUE CROSS/BLUE SHIELD | Admitting: Certified Nurse Midwife

## 2016-07-26 VITALS — BP 114/72 | HR 70 | Resp 16 | Ht 65.75 in | Wt 168.0 lb

## 2016-07-26 DIAGNOSIS — N898 Other specified noninflammatory disorders of vagina: Secondary | ICD-10-CM

## 2016-07-26 DIAGNOSIS — N390 Urinary tract infection, site not specified: Secondary | ICD-10-CM | POA: Diagnosis not present

## 2016-07-26 LAB — POCT URINALYSIS DIPSTICK
Bilirubin, UA: NEGATIVE
Blood, UA: NEGATIVE
GLUCOSE UA: NEGATIVE
Ketones, UA: NEGATIVE
LEUKOCYTES UA: NEGATIVE
NITRITE UA: NEGATIVE
Protein, UA: NEGATIVE
Urobilinogen, UA: NEGATIVE E.U./dL — AB
pH, UA: 5 (ref 5.0–8.0)

## 2016-07-26 NOTE — Progress Notes (Signed)
46 y.o. Married [place] American female [phone] here with complaint of UTI, with onset  on one week ago.. Patient complaining of urinary frequency/urgency/ and pain with urination. Patient denies fever, chills, nausea or back pain. No new personal products. Patient feels not related to sexual activity. Denies any vaginal symptoms.    Contraception is NFP.  Patient drinking adequate water intake at work and home. Has noted blood in urine this am? But has not noted since. Last sexual activity was 3 days ago. Has noted some vaginal odor, but no itching or discharge change. No other health issues today.  ROS Pertinent to HPI  O: Healthy female WDWN Affect: Normal, orientation x 3 Skin : warm and dry CVAT: negative bilateral Abdomen: negative for suprapubic tenderness  Pelvic exam: External genital area: normal, no lesions Bladder,Urethra non tender, Urethral meatus non: tender, no redness  Vagina: normal vaginal discharge, normal appearance but odor noted  Wet prep taken Cervix: normal, non tender Uterus:normal,non tender , enlarged 12-14 week size(known fibroids no size change from aex 4/18) Adnexa: normal non tender, no fullness or masses   A: R/O UTI R/O vaginal infection Normal pelvic exam Enlarged uterus known fibroids no size change  P: Reviewed findings of normal pelvic exam and no uterine size change. No physical exam indication of UTI, but vaginal odor. Will treat if labs indicate. Discussed OTC AZO per OTC directions to see if this resolves the frequency. Stressed only to use for 24 hours and advise if no change. MLY:YTKPT culture, affirm Reviewed warning signs and symptoms of UTI and need to advise if occurring. Encouraged to limit soda, tea, and coffee and be sure to increase water intake.   RV prn

## 2016-07-26 NOTE — Patient Instructions (Signed)
Urinary Frequency, Adult Urinary frequency means urinating more often than usual. People with urinary frequency urinate at least 8 times in 24 hours, even if they drink a normal amount of fluid. Although they urinate more often than normal, the total amount of urine produced in a day may be normal. Urinary frequency is also called pollakiuria. What are the causes? This condition may be caused by:  A urinary tract infection.  Obesity.  Bladder problems, such as bladder stones.  Caffeine or alcohol.  Eating food or drinking fluids that irritate the bladder. These include coffee, tea, soda, artificial sweeteners, citrus, tomato-based foods, and chocolate.  Certain medicines, such as medicines that help the body get rid of extra fluid (diuretics).  Muscle or nerve weakness.  Overactive bladder.  Chronic diabetes.  Interstitial cystitis.  In men, problems with the prostate, such as an enlarged prostate.  In women, pregnancy. In some cases, the cause may not be known. What increases the risk? This condition is more likely to develop in:  Women who have gone through menopause.  Men with prostate problems.  People with a disease or injury that affects the nerves or spinal cord.  People who have or have had a condition that affects the brain, such as a stroke. What are the signs or symptoms? Symptoms of this condition include:  Feeling an urgent need to urinate often. The stress and anxiety of needing to find a bathroom quickly can make this urge worse.  Urinating 8 or more times in 24 hours.  Urinating as often as every 1 to 2 hours. How is this diagnosed? This condition is diagnosed based on your symptoms, your medical history, and a physical exam. You may have tests, such as:  Blood tests.  Urine tests.  Imaging tests, such as X-rays or ultrasounds.  A bladder test.  A test of your neurological system. This is the body system that senses the need to urinate.  A  test to check for problems in the urethra and bladder called cystoscopy. You may also be asked to keep a bladder diary. A bladder diary is a record of what you eat and drink, how often you urinate, and how much you urinate. You may need to see a health care provider who specializes in conditions of the urinary tract (urologist) or kidneys (nephrologist). How is this treated? Treatment for this condition depends on the cause. Sometimes the condition goes away on its own and treatment is not necessary. If treatment is needed, it may include:  Taking medicine.  Learning exercises that strengthen the muscles that help control urination.  Following a bladder training program. This may include:  Learning to delay going to the bathroom.  Double urinating (voiding). This helps if you are not completely emptying your bladder.  Scheduled voiding.  Making diet changes, such as:  Avoiding caffeine.  Drinking fewer fluids, especially alcohol.  Not drinking in the evening.  Not having foods or drinks that may irritate the bladder.  Eating foods that help prevent or ease constipation. Constipation can make this condition worse.  Having the nerves in your bladder stimulated. There are two options for stimulating the nerves to your bladder:  Outpatient electrical nerve stimulation. This is done by your health care provider.  Surgery to implant a bladder pacemaker. The pacemaker helps to control the urge to urinate. Follow these instructions at home:  Keep a bladder diary if told to by your health care provider.  Take over-the-counter and prescription medicines only as  told by your health care provider.  Do any exercises as told by your health care provider.  Follow a bladder training program as told by your health care provider.  Make any recommended diet changes.  Keep all follow-up visits as told by your health care provider. This is important. Contact a health care provider  if:  You start urinating more often.  You feel pain or irritation when you urinate.  You notice blood in your urine.  Your urine looks cloudy.  You develop a fever.  You begin vomiting. Get help right away if:  You are unable to urinate. This information is not intended to replace advice given to you by your health care provider. Make sure you discuss any questions you have with your health care provider. Document Released: 12/24/2008 Document Revised: 03/31/2015 Document Reviewed: 09/23/2014 Elsevier Interactive Patient Education  2017 [employer].

## 2016-07-27 LAB — WET PREP BY MOLECULAR PROBE
Candida species: NOT DETECTED
Gardnerella vaginalis: NOT DETECTED
TRICHOMONAS VAG: NOT DETECTED

## 2016-07-27 LAB — URINE CULTURE

## 2016-09-28 ENCOUNTER — Other Ambulatory Visit: Payer: BLUE CROSS/BLUE SHIELD

## 2017-01-09 ENCOUNTER — Encounter: Payer: Self-pay | Admitting: Certified Nurse Midwife

## 2017-04-02 ENCOUNTER — Encounter: Payer: Self-pay | Admitting: Certified Nurse Midwife

## 2017-06-05 ENCOUNTER — Other Ambulatory Visit: Payer: Self-pay

## 2017-06-05 ENCOUNTER — Emergency Department (HOSPITAL_BASED_OUTPATIENT_CLINIC_OR_DEPARTMENT_OTHER): Payer: BLUE CROSS/BLUE SHIELD

## 2017-06-05 ENCOUNTER — Emergency Department (HOSPITAL_BASED_OUTPATIENT_CLINIC_OR_DEPARTMENT_OTHER)
Admission: EM | Admit: 2017-06-05 | Discharge: 2017-06-05 | Disposition: A | Discharge status: Home / Self Care | Payer: BLUE CROSS/BLUE SHIELD | Attending: Emergency Medicine | Admitting: Emergency Medicine

## 2017-06-05 ENCOUNTER — Encounter (HOSPITAL_BASED_OUTPATIENT_CLINIC_OR_DEPARTMENT_OTHER): Payer: Self-pay | Admitting: Emergency Medicine

## 2017-06-05 DIAGNOSIS — R079 Chest pain, unspecified: Secondary | ICD-10-CM

## 2017-06-05 DIAGNOSIS — R011 Cardiac murmur, unspecified: Secondary | ICD-10-CM | POA: Diagnosis not present

## 2017-06-05 DIAGNOSIS — I341 Nonrheumatic mitral (valve) prolapse: Secondary | ICD-10-CM

## 2017-06-05 DIAGNOSIS — R0789 Other chest pain: Secondary | ICD-10-CM | POA: Diagnosis not present

## 2017-06-05 NOTE — ED Triage Notes (Signed)
Patient states that she has pain to her left chest and breast x 1 month - she denies that it hurts worse with movement but then states " I feel it when I move"  - patient denies any SOB

## 2017-06-05 NOTE — Discharge Instructions (Addendum)
Your EKG, and chest x-ray were reassuring and normal today. You have a murmur with your heart.  It sounds like this was diagnosed by your cardiologist.  It sounds like a murmur consistent with mitral valve prolapse. Mitral valve prolapse is not dangerous.  It can cause intermittent symptoms including chest pain. Continue to follow with your cardiologist as previously instructed.

## 2017-06-05 NOTE — ED Provider Notes (Signed)
[facility] EMERGENCY DEPARTMENT Provider Note   CSN: [id number] Arrival date & time: 06/05/17  1534     History   Chief Complaint Chief Complaint  Patient presents with  . Chest Pain    HPI Kathy Craig is a 47 y.o. female.  Chief complaint: I am still having chest pain  HPI 46 year old female.  Has been having pain in her left chest for 6 weeks.  Was seen by her primary care physician at [facility] in January.  States she was referred to see a cardiologist.  She states "they did that thing where they put the stickers on my chest".  Per her best recollection she was told that "it might be a heart murmur or something".  She states her cardiologist told her to come see them again in 1 year.  She describes pain just above her left breast and her left chest.  It is not predictable.  Does not come on with exertion.  Initially stated "I thought it was because am not sleeping well".  Takes trazodone but still has insomnia.  No dyspnea.  No cough.  No fever.  No leg swelling.  Past Medical History:  Diagnosis Date  . Fibroid    5.5 cm possible partially submucous fibroid  . Hydrosalpinx 10/15/14   right    There are no active problems to display for this patient.   Past Surgical History:  Procedure Laterality Date  . mirena     removed 2013     OB History    Gravida  3   Para  1   Term  1   Preterm      AB  2   Living  1     SAB      TAB  2   Ectopic      Multiple      Live Births  1            Home Medications    Prior to Admission medications   Medication Sig Start Date End Date Taking? Authorizing Provider  TraZODone & Diet Manage Prod (TRAZAMINE PO) Take by mouth.   Yes [provider]  ibuprofen (ADVIL,MOTRIN) 800 MG tablet TAKE 1 TABLET BY MOUTH EVERY 8 HOURS AS NEEDED 03/14/16   Regina Eck, CNM  Vitamin D, Ergocalciferol, (DRISDOL) 50000 units CAPS capsule Take 1 capsule (50,000 Units total) by mouth every 7  (seven) days. 06/29/16   Regina Eck, CNM    Family History Family History  Problem Relation Age of Onset  . Heart disease Mother        CHF  . Hypertension Mother   . Diabetes Father   . Sarcoidosis Sister 91       deceased as complication of  . Heart attack Maternal Uncle   . Breast cancer Maternal Grandmother 39        ? death from breast cancer  . Hypertension Maternal Grandmother     Social History Social History   Tobacco Use  . Smoking status: Never Smoker  . Smokeless tobacco: Never Used  Substance Use Topics  . Alcohol use: Yes    Alcohol/week: 1.2 oz    Types: 2 Standard drinks or equivalent per week  . Drug use: No     Allergies   Patient has no known allergies.   Review of Systems Review of Systems  Constitutional: Negative for appetite change, chills, diaphoresis, fatigue and fever.  HENT: Negative for mouth sores, sore  throat and trouble swallowing.   Eyes: Negative for visual disturbance.  Respiratory: Negative for cough, chest tightness, shortness of breath and wheezing.   Cardiovascular: Positive for chest pain.  Gastrointestinal: Negative for abdominal distention, abdominal pain, diarrhea, nausea and vomiting.  Endocrine: Negative for polydipsia, polyphagia and polyuria.  Genitourinary: Negative for dysuria, frequency and hematuria.  Musculoskeletal: Negative for gait problem.  Skin: Negative for color change, pallor and rash.  Neurological: Negative for dizziness, syncope, light-headedness and headaches.  Hematological: Does not bruise/bleed easily.  Psychiatric/Behavioral: Negative for behavioral problems and confusion.     Physical Exam Updated Vital Signs BP (!) 127/55 (BP Location: Right Arm)   Pulse 71   Temp 98.2 F (36.8 C) (Oral)   Resp 20   Ht 5' 5.5" (1.664 m)   Wt 74.8 kg (165 lb)   LMP 05/11/2017   SpO2 100%   BMI 27.04 kg/m   Physical Exam  Constitutional: She is oriented to person, place, and time. She appears  well-developed and well-nourished. No distress.  HENT:  Head: Normocephalic.  Eyes: Pupils are equal, round, and reactive to light. Conjunctivae are normal. No scleral icterus.  Neck: Normal range of motion. Neck supple. No thyromegaly present.  Cardiovascular: Normal rate and regular rhythm. Exam reveals no gallop and no friction rub.  Murmur heard.  Systolic murmur is present with a grade of 2/6. Pulmonary/Chest: Effort normal and breath sounds normal. No respiratory distress. She has no wheezes. She has no rales.  Abdominal: Soft. Bowel sounds are normal. She exhibits no distension. There is no tenderness. There is no rebound.  Musculoskeletal: Normal range of motion.  Neurological: She is alert and oriented to person, place, and time.  Skin: Skin is warm and dry. No rash noted.  Psychiatric: She has a normal mood and affect. Her behavior is normal.     ED Treatments / Results  Labs (all labs ordered are listed, but only abnormal results are displayed) Labs Reviewed - No data to display  EKG EKG Interpretation  Date/Time:  Tuesday June 05 2017 15:46:05 EDT Ventricular Rate:  68 PR Interval:    QRS Duration: 99 QT Interval:  443 QTC Calculation: 472 R Axis:   45 Text Interpretation:  Sinus rhythm Borderline T abnormalities, anterior leads Confirmed by Tanna Furry [id number] on 06/05/2017 4:02:17 PM   Radiology Dg Chest 2 View  Result Date: 06/05/2017 CLINICAL DATA:  Chest pain for 1 month EXAM: CHEST - 2 VIEW COMPARISON:  None. FINDINGS: The heart size and mediastinal contours are within normal limits. Both lungs are clear. The visualized skeletal structures are unremarkable. IMPRESSION: No active cardiopulmonary disease. Electronically Signed   By: Inez Catalina M.D.   On: 06/05/2017 16:24    Procedures Procedures (including critical care time)  Medications Ordered in ED Medications - No data to display   Initial Impression / Assessment and Plan / ED Course  I have  reviewed the triage vital signs and the nursing notes.  Pertinent labs & imaging results that were available during my care of the patient were reviewed by me and considered in my medical decision making (see chart for details).    Murmur on exam may be consistent with MVP.  I discussed with her the symptoms of mitral valve prolapse.  She states that this sounded very familiar with what her cardiologist had told her.  Plan x-ray.  She has clear lungs.  Is well oxygenated.  Normal stable vitals.  And no cardiac risk factors.  I am not concerned that this pain is cardiac in nature and less related to MVP.  Will reevaluate and further discuss after x-ray.  X-ray with no acute findings.  EKG no acute findings.  Murmur consistent with mitral valve prolapse and history and probable cardiology evaluation consistent with same.  Patient reassured.  Discharged home.  Continue plan follow-up with cardiologist.  Final Clinical Impressions(s) / ED Diagnoses   Final diagnoses:  Chest pain, unspecified type  Heart murmur  Mitral valve prolapse syndrome    ED Discharge Orders    None       Tanna Furry, MD 06/05/17 [phone]

## 2017-06-07 ENCOUNTER — Other Ambulatory Visit: Payer: Self-pay | Admitting: Certified Nurse Midwife

## 2017-06-07 DIAGNOSIS — Z1231 Encounter for screening mammogram for malignant neoplasm of breast: Secondary | ICD-10-CM

## 2017-06-26 ENCOUNTER — Encounter: Payer: Self-pay | Admitting: Certified Nurse Midwife

## 2017-06-26 ENCOUNTER — Ambulatory Visit: Payer: BLUE CROSS/BLUE SHIELD | Admitting: Certified Nurse Midwife

## 2017-06-26 NOTE — Progress Notes (Deleted)
47 y.o. [id number] Married  {Race/ethnicity:17218} Fe here for annual exam.    No LMP recorded.          Sexually active: {yes no:314532}  The current method of family planning is {contraception:315051}.    Exercising: {yes no:314532}  {types:19826} Smoker:  {YES NO:22349}  Health Maintenance: Pap:  04-20-14 neg HPV HR neg, 06-23-16 neg History of Abnormal Pap: {YES NO:22349} MMG:  07-11-16 category b density birads 1-neg Self Breast exams: {YES NO:22349} Colonoscopy:  none BMD:   none TDaP:  2017 Shingles: no Pneumonia: no Hep C and HIV: *** Labs: ***   reports that she has never smoked. She has never used smokeless tobacco. She reports that she drinks about 1.2 oz of alcohol per week. She reports that she does not use drugs.  Past Medical History:  Diagnosis Date  . Fibroid    5.5 cm possible partially submucous fibroid  . Hydrosalpinx 10/15/14   right    Past Surgical History:  Procedure Laterality Date  . mirena     removed 2013    Current Outpatient Medications  Medication Sig Dispense Refill  . ibuprofen (ADVIL,MOTRIN) 800 MG tablet TAKE 1 TABLET BY MOUTH EVERY 8 HOURS AS NEEDED 30 tablet 0  . TraZODone & Diet Manage Prod (TRAZAMINE PO) Take by mouth.    . Vitamin D, Ergocalciferol, (DRISDOL) 50000 units CAPS capsule Take 1 capsule (50,000 Units total) by mouth every 7 (seven) days. 12 capsule 0   No current facility-administered medications for this visit.     Family History  Problem Relation Age of Onset  . Heart disease Mother        CHF  . Hypertension Mother   . Diabetes Father   . Sarcoidosis Sister 45       deceased as complication of  . Heart attack Maternal Uncle   . Breast cancer Maternal Grandmother 56        ? death from breast cancer  . Hypertension Maternal Grandmother     ROS:  Pertinent items are noted in HPI.  Otherwise, a comprehensive ROS was negative.  Exam:   There were no vitals taken for this visit.   Ht Readings from Last 3  Encounters:  06/05/17 5' 5.5" (1.664 m)  07/26/16 5' 5.75" (1.67 m)  06/23/16 5' 5.75" (1.67 m)    General appearance: alert, cooperative and appears stated age Head: Normocephalic, without obvious abnormality, atraumatic Neck: no adenopathy, supple, symmetrical, trachea midline and thyroid {EXAM; THYROID:18604} Lungs: clear to auscultation bilaterally Breasts: {Exam; breast:13139::"normal appearance, no masses or tenderness"} Heart: regular rate and rhythm Abdomen: soft, non-tender; no masses,  no organomegaly Extremities: extremities normal, atraumatic, no cyanosis or edema Skin: Skin color, texture, turgor normal. No rashes or lesions Lymph nodes: Cervical, supraclavicular, and axillary nodes normal. No abnormal inguinal nodes palpated Neurologic: Grossly normal   Pelvic: External genitalia:  no lesions              Urethra:  normal appearing urethra with no masses, tenderness or lesions              Bartholin's and Skene's: normal                 Vagina: normal appearing vagina with normal color and discharge, no lesions              Cervix: {exam; cervix:14595}              Pap taken: {yes no:314532} Bimanual Exam:  Uterus:  {exam; uterus:12215}              Adnexa: {exam; adnexa:12223}               Rectovaginal: Confirms               Anus:  normal sphincter tone, no lesions  Chaperone present: ***  A:  Well Woman with normal exam  P:   Reviewed health and wellness pertinent to exam  Pap smear: {YES NO:22349}  {plan; gyn:5269::"mammogram","pap smear","return annually or prn"}  An After Visit Summary was printed and given to the patient.

## 2017-07-12 ENCOUNTER — Ambulatory Visit
Admission: RE | Admit: 2017-07-12 | Discharge: 2017-07-12 | Disposition: A | Discharge status: Home / Self Care | Payer: BLUE CROSS/BLUE SHIELD | Source: Ambulatory Visit | Attending: Certified Nurse Midwife | Admitting: Certified Nurse Midwife

## 2017-07-12 DIAGNOSIS — Z1231 Encounter for screening mammogram for malignant neoplasm of breast: Secondary | ICD-10-CM

## 2017-08-20 ENCOUNTER — Encounter: Payer: Self-pay | Admitting: Certified Nurse Midwife

## 2017-08-23 ENCOUNTER — Ambulatory Visit: Payer: BLUE CROSS/BLUE SHIELD | Admitting: Certified Nurse Midwife

## 2017-12-06 ENCOUNTER — Ambulatory Visit (INDEPENDENT_AMBULATORY_CARE_PROVIDER_SITE_OTHER): Payer: BLUE CROSS/BLUE SHIELD | Admitting: Certified Nurse Midwife

## 2017-12-06 ENCOUNTER — Encounter: Payer: Self-pay | Admitting: Certified Nurse Midwife

## 2017-12-06 ENCOUNTER — Ambulatory Visit: Payer: BLUE CROSS/BLUE SHIELD | Admitting: Certified Nurse Midwife

## 2017-12-06 ENCOUNTER — Other Ambulatory Visit: Payer: Self-pay

## 2017-12-06 ENCOUNTER — Encounter

## 2017-12-06 VITALS — BP 108/62 | HR 68 | Resp 16 | Ht 64.75 in | Wt 160.0 lb

## 2017-12-06 DIAGNOSIS — N852 Hypertrophy of uterus: Secondary | ICD-10-CM | POA: Diagnosis not present

## 2017-12-06 DIAGNOSIS — Z862 Personal history of diseases of the blood and blood-forming organs and certain disorders involving the immune mechanism: Secondary | ICD-10-CM | POA: Diagnosis not present

## 2017-12-06 DIAGNOSIS — D219 Benign neoplasm of connective and other soft tissue, unspecified: Secondary | ICD-10-CM

## 2017-12-06 DIAGNOSIS — Z01419 Encounter for gynecological examination (general) (routine) without abnormal findings: Secondary | ICD-10-CM

## 2017-12-06 NOTE — Patient Instructions (Signed)

## 2017-12-06 NOTE — Progress Notes (Signed)
47 y.o. [id number] Married  African American Fe here for annual exam. Contraception none, pregnancy would be welcome. Has changed positions at work and walks all day long and has lost 11 pounds. Sees Heber Ventura at [facility] for aex and labs. Being treated for anemia. Periods are heavy, but not as before and no pain with now. No health issues today.   LMP 11/21/17     Sexually active: Yes.    The current method of family planning is none.    Exercising: No.  exercise Smoker:  no  Review of Systems  Constitutional: Positive for weight loss.  HENT: Negative.   Eyes: Negative.   Respiratory: Negative.   Cardiovascular: Negative.   Gastrointestinal: Negative.   Genitourinary:       Loss of sexual interest  Musculoskeletal: Negative.   Skin: Negative.   Neurological: Negative.   Endo/Heme/Allergies: Negative.   Psychiatric/Behavioral: Negative.     Health Maintenance: Pap:  06-23-16 neg History of Abnormal Pap: no MMG:  07-12-17 category b density birads 1:neg Self Breast exams: yes Colonoscopy:  none BMD:   none TDaP:  2017 Shingles: no Pneumonia: no Hep C and HIV: both neg per patient Labs: no   reports that she has never smoked. She has never used smokeless tobacco. She reports that she drinks about 2.0 standard drinks of alcohol per week. She reports that she does not use drugs.  Past Medical History:  Diagnosis Date  . Fibroid    5.5 cm possible partially submucous fibroid  . Hydrosalpinx 10/15/14   right    Past Surgical History:  Procedure Laterality Date  . mirena     removed 2013    Current Outpatient Medications  Medication Sig Dispense Refill  . ibuprofen (ADVIL,MOTRIN) 800 MG tablet TAKE 1 TABLET BY MOUTH EVERY 8 HOURS AS NEEDED 30 tablet 0  . TraZODone & Diet Manage Prod (TRAZAMINE PO) Take by mouth.    . Vitamin D, Ergocalciferol, (DRISDOL) 50000 units CAPS capsule Take 1 capsule (50,000 Units total) by mouth every 7 (seven) days. 12 capsule 0   No  current facility-administered medications for this visit.     Family History  Problem Relation Age of Onset  . Heart disease Mother        CHF  . Hypertension Mother   . Diabetes Father   . Sarcoidosis Sister 20       deceased as complication of  . Heart attack Maternal Uncle   . Breast cancer Maternal Grandmother 41        ? death from breast cancer  . Hypertension Maternal Grandmother     ROS:  Pertinent items are noted in HPI.  Otherwise, a comprehensive ROS was negative.  Exam:   There were no vitals taken for this visit.   Ht Readings from Last 3 Encounters:  06/05/17 5' 5.5" (1.664 m)  07/26/16 5' 5.75" (1.67 m)  06/23/16 5' 5.75" (1.67 m)    General appearance: alert, cooperative and appears stated age Head: Normocephalic, without obvious abnormality, atraumatic Neck: no adenopathy, supple, symmetrical, trachea midline and thyroid normal to inspection and palpation Lungs: clear to auscultation bilaterally Breasts: normal appearance, no masses or tenderness, No nipple retraction or dimpling, No nipple discharge or bleeding, No axillary or supraclavicular adenopathy Heart: regular rate and rhythm Abdomen: soft, non-tender; no masses,  no organomegaly Extremities: extremities normal, atraumatic, no cyanosis or edema Skin: Skin color, texture, turgor normal. No rashes or lesions Lymph nodes: Cervical, supraclavicular, and  axillary nodes normal. No abnormal inguinal nodes palpated Neurologic: Grossly normal   Pelvic: External genitalia:  no lesions              Urethra:  normal appearing urethra with no masses, tenderness or lesions              Bartholin's and Skene's: normal                 Vagina: normal appearing vagina with normal color and discharge, no lesions              Cervix: multiparous appearance, no lesions and normal appearance              Pap taken: No. Bimanual Exam:  Uterus:  enlarged, 14 week size, no change from previous exam weeks size               Adnexa: normal adnexa and no mass, fullness, tenderness               Rectovaginal: Confirms               Anus:  normal sphincter tone, no lesions  Chaperone present: yes  A:  Well Woman with normal exam  Contraception none  Enlarged uterus, known fibroids, no size change  Anemia probably related to heavy periods being managed by PCP  Intentional weight loss  P:   Reviewed health and wellness pertinent to exam  Aware of warning signs with fibroids and need to advise if bleeding increases or she feel size increase of fibroids. Patient agreeable.  Continue follow up with PCP as indicated.  Encouraged to continue weight loss journey for better health .  Pap smear: no   counseled on breast self exam, mammography screening, adequate intake of calcium and vitamin D, diet and exercise  return annually or prn  An After Visit Summary was printed and given to the patient.

## 2018-02-14 DIAGNOSIS — E611 Iron deficiency: Secondary | ICD-10-CM | POA: Insufficient documentation

## 2018-05-30 ENCOUNTER — Other Ambulatory Visit: Payer: Self-pay | Admitting: Certified Nurse Midwife

## 2018-05-30 DIAGNOSIS — Z1231 Encounter for screening mammogram for malignant neoplasm of breast: Secondary | ICD-10-CM

## 2018-07-04 ENCOUNTER — Telehealth: Payer: Self-pay | Admitting: Certified Nurse Midwife

## 2018-07-04 NOTE — Telephone Encounter (Signed)
Patient has been having pain on her right side for two weeks. She is asking to come in to see Dr.Silva if possible.

## 2018-07-04 NOTE — Telephone Encounter (Signed)
Spoke with patient. Reports right sided pelvic pain 5/10 for 2wks, has gotten worse the last 2 days. Brown vaginal d/c, no odor. Urinary frequency, voiding small amounts. Denies lower back pain, fever/chills, N/V, blood in urine. LMP approximately 2-3 wks ago, unsure of exact date. No contraceptive, SA. Covid 19 screen negative. Requesting OV with Dr. Quincy Simmonds. OV scheduled for 4/24 at 1pm with Dr. Quincy Simmonds. Advised patient to take UPT to r/o pregnancy, if positive seek immediate care at [facility].  If symptoms worsen or new symptoms develop, urgent care or ER if after hours. Advised covering provider will review, our office will return call if any additional recommendations.   Last AEX 07/06/17 DL  Routing to provider for final review. Patient is agreeable to disposition. Will close encounter.  Cc: Dr. Quincy Simmonds

## 2018-07-05 ENCOUNTER — Ambulatory Visit (INDEPENDENT_AMBULATORY_CARE_PROVIDER_SITE_OTHER): Payer: BLUE CROSS/BLUE SHIELD | Admitting: Obstetrics and Gynecology

## 2018-07-05 ENCOUNTER — Telehealth: Payer: Self-pay | Admitting: Obstetrics and Gynecology

## 2018-07-05 ENCOUNTER — Other Ambulatory Visit: Payer: Self-pay

## 2018-07-05 ENCOUNTER — Encounter: Payer: Self-pay | Admitting: Obstetrics and Gynecology

## 2018-07-05 VITALS — BP 110/70 | HR 68 | Temp 98.2°F | Resp 16 | Wt 164.0 lb

## 2018-07-05 DIAGNOSIS — R102 Pelvic and perineal pain: Secondary | ICD-10-CM | POA: Diagnosis not present

## 2018-07-05 DIAGNOSIS — D219 Benign neoplasm of connective and other soft tissue, unspecified: Secondary | ICD-10-CM | POA: Diagnosis not present

## 2018-07-05 DIAGNOSIS — N852 Hypertrophy of uterus: Secondary | ICD-10-CM

## 2018-07-05 DIAGNOSIS — N760 Acute vaginitis: Secondary | ICD-10-CM

## 2018-07-05 DIAGNOSIS — R350 Frequency of micturition: Secondary | ICD-10-CM | POA: Diagnosis not present

## 2018-07-05 DIAGNOSIS — N7011 Chronic salpingitis: Secondary | ICD-10-CM

## 2018-07-05 LAB — POCT URINALYSIS DIPSTICK
Bilirubin, UA: NEGATIVE
Blood, UA: NEGATIVE
Glucose, UA: NEGATIVE
Ketones, UA: NEGATIVE
Leukocytes, UA: NEGATIVE
Nitrite, UA: NEGATIVE
Protein, UA: NEGATIVE
Urobilinogen, UA: NEGATIVE E.U./dL — AB
pH, UA: 5 (ref 5.0–8.0)

## 2018-07-05 LAB — POCT URINE PREGNANCY: Preg Test, Ur: NEGATIVE

## 2018-07-05 NOTE — Telephone Encounter (Signed)
Patient will need an ultrasound appointment with me for next week for her fibroids.   This has been sent to precert.

## 2018-07-05 NOTE — Progress Notes (Signed)
GYNECOLOGY  VISIT   HPI: 48 y.o.   Married  [place] American  female   [id number] for urinary frequency, vaginal discharge & pelvic pain.    States right sided pelvic pain for 2 - 3 weeks.  Worse for the last few days.  It is a constant pain that is neither sharp or dull.  Not radiating.  Pain is 5 - 6/10.  Ibuprofen 800 mg helps the pain.   Good appetite.  No fever, shakes or chills.  No nausea, vomiting.   Last BM was last night.  No change in pain.   No dysuria.   Notes a little bit of brown discharge, starting a couple of days ago.  No itching, burning, or odor.   Menses are monthly, heavy first few days with clots.  Does pad 2 - 3 times during the day.  Some pain with her cycles and takes Ibuprofen to control this.   Has 14 week size uterus with her last pelvic exam on 12/06/17 with her primary GYN, Evalee Mutton.  Has anemia and takes iron daily.  Normal hgb 13.0 on 02/12/18.   Is taking Lorazepam for insomnia.   Struggling with red eyes.  Saw her PCP.   GYNECOLOGIC HISTORY: LMP 2-3 weeks ago Contraception:  none Menopausal hormone therapy:  none Last mammogram:  07-12-17 category b density birads 1:neg Last pap smear:   06-23-16 neg poct urine-neg        OB History    Gravida  3   Para  1   Term  1   Preterm      AB  2   Living  1     SAB      TAB  2   Ectopic      Multiple      Live Births  1              There are no active problems to display for this patient.   Past Medical History:  Diagnosis Date  . Fibroid    5.5 cm possible partially submucous fibroid  . Hydrosalpinx 10/15/14   right    Past Surgical History:  Procedure Laterality Date  . mirena     removed 2013    Current Outpatient Medications  Medication Sig Dispense Refill  . Cholecalciferol (VITAMIN D PO) Take by mouth.    . IRON PO Take by mouth.    Marland Kitchen LORazepam (ATIVAN) 0.5 MG tablet Take 0.5 mg by mouth every 8 (eight) hours.     No current  facility-administered medications for this visit.      ALLERGIES: Patient has no known allergies.  Family History  Problem Relation Age of Onset  . Heart disease Mother        CHF  . Hypertension Mother   . Diabetes Father   . Sarcoidosis Sister 68       deceased as complication of  . Heart attack Maternal Uncle   . Breast cancer Maternal Grandmother 87        ? death from breast cancer  . Hypertension Maternal Grandmother     Social History   Socioeconomic History  . Marital status: Married    Spouse name: Not on file  . Number of children: Not on file  . Years of education: Not on file  . Highest education level: Not on file  Occupational History  . Not on file  Social Needs  . Financial resource strain: Not on file  .  Food insecurity:    Worry: Not on file    Inability: Not on file  . Transportation needs:    Medical: Not on file    Non-medical: Not on file  Tobacco Use  . Smoking status: Never Smoker  . Smokeless tobacco: Never Used  Substance and Sexual Activity  . Alcohol use: Yes    Alcohol/week: 2.0 standard drinks    Types: 2 Standard drinks or equivalent per week  . Drug use: No  . Sexual activity: Yes    Partners: Male    Birth control/protection: None  Lifestyle  . Physical activity:    Days per week: Not on file    Minutes per session: Not on file  . Stress: Not on file  Relationships  . Social connections:    Talks on phone: Not on file    Gets together: Not on file    Attends religious service: Not on file    Active member of club or organization: Not on file    Attends meetings of clubs or organizations: Not on file    Relationship status: Not on file  . Intimate partner violence:    Fear of current or ex partner: Not on file    Emotionally abused: Not on file    Physically abused: Not on file    Forced sexual activity: Not on file  Other Topics Concern  . Not on file  Social History Narrative  . Not on file    Review of Systems   Constitutional: Negative.   HENT: Negative.   Eyes: Negative.   Respiratory: Negative.   Cardiovascular: Negative.   Gastrointestinal: Negative.   Endocrine: Negative.   Genitourinary: Positive for frequency, pelvic pain and vaginal discharge.  Musculoskeletal: Negative.   Skin: Negative.   Allergic/Immunologic: Negative.   Neurological: Negative.   Psychiatric/Behavioral: Negative.     PHYSICAL EXAMINATION:    BP 110/70   Pulse 68   Temp 98.2 F (36.8 C) (Oral)   Resp 16   Wt 164 lb (74.4 kg)   LMP 06/12/2018 (Exact Date) Comment: 2-3 weeks ago  BMI 27.50 kg/m     General appearance: alert, cooperative and appears stated age   Abdomen: soft, uterus palpable to 2 cm under umbilicus, extending to the right side, mobile, nontender.   Pelvic: External genitalia:  no lesions              Urethra:  normal appearing urethra with no masses, tenderness or lesions              Bartholins and Skenes: normal                 Vagina: normal appearing vagina with large amount of yellow discharge.               Cervix: no lesions                Bimanual Exam:  Uterus:  18 week size.  Fibroid in LUS and extending to right.               Adnexa: no mass, fullness, tenderness                Chaperone was present for exam.  ASSESSMENT  Fibroids and markedly enlarged uterus.  Urinary frequency due to fibroid uterus.  Negative urine dip.  Vaginitis.  RLQ pain.  No acute abdomen.  Doubt appendicitis.  I think this is GYN in origin.  Hx right hydrosalpinx.  PLAN  We discussed her fibroids and hydrosalpinx.  CBC, iron, ferritin.  Affirm. UPT - negative.  Return for pelvic US.    An After Visit Summary was printed and given to the patient.  __25____ minutes face to face time of which over 50% was spent in counseling.

## 2018-07-05 NOTE — Telephone Encounter (Signed)
Spoke with patient. PUS scheduled for 07/11/18 at 1:30pm, consult at 2pm with Dr. Quincy Simmonds. Patient verbalizes understanding and is agreeable.   Routing to provider for final review. Patient is agreeable to disposition. Will close encounter.  Cc: Thayer Ohm

## 2018-07-05 NOTE — Patient Instructions (Signed)

## 2018-07-06 LAB — CBC
Hematocrit: 39.9 % (ref 34.0–46.6)
Hemoglobin: 13.1 g/dL (ref 11.1–15.9)
MCH: 32.4 pg (ref 26.6–33.0)
MCHC: 32.8 g/dL (ref 31.5–35.7)
MCV: 99 fL — ABNORMAL HIGH (ref 79–97)
Platelets: 309 10*3/uL (ref 150–450)
RBC: 4.04 x10E6/uL (ref 3.77–5.28)
RDW: 11.3 % — ABNORMAL LOW (ref 11.7–15.4)
WBC: 6.5 10*3/uL (ref 3.4–10.8)

## 2018-07-06 LAB — VAGINITIS/VAGINOSIS, DNA PROBE
Candida Species: NEGATIVE
Gardnerella vaginalis: NEGATIVE
Trichomonas vaginosis: NEGATIVE

## 2018-07-06 LAB — FERRITIN: Ferritin: 41 ng/mL (ref 15–150)

## 2018-07-06 LAB — IRON: Iron: 80 ug/dL (ref 27–159)

## 2018-07-07 DIAGNOSIS — N7011 Chronic salpingitis: Secondary | ICD-10-CM | POA: Insufficient documentation

## 2018-07-07 DIAGNOSIS — D219 Benign neoplasm of connective and other soft tissue, unspecified: Secondary | ICD-10-CM | POA: Insufficient documentation

## 2018-07-11 ENCOUNTER — Encounter: Payer: Self-pay | Admitting: Obstetrics and Gynecology

## 2018-07-11 ENCOUNTER — Ambulatory Visit (INDEPENDENT_AMBULATORY_CARE_PROVIDER_SITE_OTHER): Payer: BLUE CROSS/BLUE SHIELD | Admitting: Obstetrics and Gynecology

## 2018-07-11 ENCOUNTER — Other Ambulatory Visit: Payer: Self-pay

## 2018-07-11 ENCOUNTER — Ambulatory Visit (INDEPENDENT_AMBULATORY_CARE_PROVIDER_SITE_OTHER): Payer: BLUE CROSS/BLUE SHIELD

## 2018-07-11 VITALS — BP 118/62 | HR 76 | Resp 12 | Ht 65.0 in | Wt 161.0 lb

## 2018-07-11 DIAGNOSIS — D219 Benign neoplasm of connective and other soft tissue, unspecified: Secondary | ICD-10-CM

## 2018-07-11 DIAGNOSIS — N7011 Chronic salpingitis: Secondary | ICD-10-CM

## 2018-07-11 DIAGNOSIS — N852 Hypertrophy of uterus: Secondary | ICD-10-CM

## 2018-07-11 NOTE — Progress Notes (Signed)
Encounter reviewed by Dr. Aundria Rud.

## 2018-07-11 NOTE — Progress Notes (Signed)
GYNECOLOGY  VISIT   HPI: 48 y.o.   Married  [place] American  female   [id number] with Patient's last menstrual period was 06/12/2018 (exact date).   here for an ultrasound     Seen for RLQ pain on 07/05/18.  She has an enlarged 18 week size uterus with fibroids and a hx of right hydrosalpinx.  She is feeling better.  Had not taken iron and vitamin D.  Her CBC, iron and ferritin.   Patient states decreased interest in sex.  Tired at the end of the day.  Son just moved out.   GYNECOLOGIC HISTORY: Patient's last menstrual period was 06/12/2018 (exact date). Contraception:  none Menopausal hormone therapy:  none Last mammogram:  07/12/17 birads 1 negative/density b Last pap smear:   06/23/16 Negative        OB History    Gravida  3   Para  1   Term  1   Preterm      AB  2   Living  1     SAB      TAB  2   Ectopic      Multiple      Live Births  1              Patient Active Problem List   Diagnosis Date Noted  . Fibroids 07/07/2018  . Hydrosalpinx 07/07/2018    Past Medical History:  Diagnosis Date  . Fibroid    5.5 cm possible partially submucous fibroid  . Hydrosalpinx 10/15/14   right    Past Surgical History:  Procedure Laterality Date  . mirena     removed 2013    Current Outpatient Medications  Medication Sig Dispense Refill  . Cholecalciferol (VITAMIN D PO) Take by mouth.    . IRON PO Take by mouth.    Marland Kitchen LORazepam (ATIVAN) 0.5 MG tablet Take 0.5 mg by mouth every 8 (eight) hours.     No current facility-administered medications for this visit.      ALLERGIES: Patient has no known allergies.  Family History  Problem Relation Age of Onset  . Heart disease Mother        CHF  . Hypertension Mother   . Diabetes Father   . Sarcoidosis Sister 13       deceased as complication of  . Heart attack Maternal Uncle   . Breast cancer Maternal Grandmother 44        ? death from breast cancer  . Hypertension Maternal Grandmother     Social  History   Socioeconomic History  . Marital status: Married    Spouse name: Not on file  . Number of children: Not on file  . Years of education: Not on file  . Highest education level: Not on file  Occupational History  . Not on file  Social Needs  . Financial resource strain: Not on file  . Food insecurity:    Worry: Not on file    Inability: Not on file  . Transportation needs:    Medical: Not on file    Non-medical: Not on file  Tobacco Use  . Smoking status: Never Smoker  . Smokeless tobacco: Never Used  Substance and Sexual Activity  . Alcohol use: Yes    Alcohol/week: 2.0 standard drinks    Types: 2 Standard drinks or equivalent per week  . Drug use: No  . Sexual activity: Yes    Partners: Male    Birth control/protection: None  Lifestyle  . Physical activity:    Days per week: Not on file    Minutes per session: Not on file  . Stress: Not on file  Relationships  . Social connections:    Talks on phone: Not on file    Gets together: Not on file    Attends religious service: Not on file    Active member of club or organization: Not on file    Attends meetings of clubs or organizations: Not on file    Relationship status: Not on file  . Intimate partner violence:    Fear of current or ex partner: Not on file    Emotionally abused: Not on file    Physically abused: Not on file    Forced sexual activity: Not on file  Other Topics Concern  . Not on file  Social History Narrative  . Not on file    Review of Systems  Constitutional: Negative.   HENT: Negative.   Eyes: Negative.   Respiratory: Negative.   Cardiovascular: Negative.   Gastrointestinal: Negative.   Endocrine: Negative.   Genitourinary: Negative.   Musculoskeletal: Negative.   Skin: Negative.   Allergic/Immunologic: Negative.   Neurological: Negative.   Hematological: Negative.   Psychiatric/Behavioral: Negative.     PHYSICAL EXAMINATION:    BP 118/62 (BP Location: Right Arm, Patient  Position: Sitting, Cuff Size: Large)   Pulse 76   Resp 12   Ht 5' 5"  (1.651 m)   Wt 161 lb (73 kg)   LMP 06/12/2018 (Exact Date) Comment: 2-3 weeks ago  BMI 26.79 kg/m     General appearance: alert, cooperative and appears stated age  Pelvic US.  Uterus with 3 fibroids - 5.75 cm, 1.82 cm, 1.02 cm. EMS 5.57 mm, distorted by fibroid with submucous component.  Right ovary 34 mm simple cyst, no abnormal blood flow.  No hydrosalpinx noted.  Left ovary normal.  No free fluid.   Chaperone was present for exam.  ASSESSMENT  Fibroid uterus - stable.  Hx anemia.  Intolerance to iron.  Simple right ovarian cyst. No hydrosalpinx.   Decreased libido.   PLAN  We discussed fibroids - their benign nature, natural history, signs and symptoms.  We reviewed tx options for fibroids - observation, hormonal contraceptives, hysterectomy, uterine artery embolization referred to.  We also talked about decreased libido and multifactorial etiology for this.  We declines testosterone tx.  She will do dietary sources of iron.  Follow up for annual exam and blood work in Sept. 2020.    An After Visit Summary was printed and given to the patient.  __25____ minutes face to face time of which over 50% was spent in counseling.

## 2018-07-11 NOTE — Patient Instructions (Signed)

## 2018-07-16 ENCOUNTER — Ambulatory Visit: Payer: BLUE CROSS/BLUE SHIELD

## 2018-08-29 ENCOUNTER — Ambulatory Visit: Payer: BLUE CROSS/BLUE SHIELD

## 2018-09-03 ENCOUNTER — Other Ambulatory Visit: Payer: Self-pay

## 2018-09-03 ENCOUNTER — Ambulatory Visit
Admission: RE | Admit: 2018-09-03 | Discharge: 2018-09-03 | Disposition: A | Discharge status: Home / Self Care | Payer: BC Managed Care – PPO | Source: Ambulatory Visit | Attending: Certified Nurse Midwife | Admitting: Certified Nurse Midwife

## 2018-09-03 DIAGNOSIS — Z1231 Encounter for screening mammogram for malignant neoplasm of breast: Secondary | ICD-10-CM

## 2018-12-10 ENCOUNTER — Encounter: Payer: Self-pay | Admitting: Certified Nurse Midwife

## 2018-12-10 ENCOUNTER — Other Ambulatory Visit: Payer: Self-pay

## 2018-12-10 ENCOUNTER — Ambulatory Visit (INDEPENDENT_AMBULATORY_CARE_PROVIDER_SITE_OTHER): Payer: BC Managed Care – PPO | Admitting: Certified Nurse Midwife

## 2018-12-10 VITALS — BP 100/62 | HR 68 | Temp 97.2°F | Resp 16 | Ht 64.75 in | Wt 162.0 lb

## 2018-12-10 DIAGNOSIS — Z86018 Personal history of other benign neoplasm: Secondary | ICD-10-CM | POA: Diagnosis not present

## 2018-12-10 DIAGNOSIS — N946 Dysmenorrhea, unspecified: Secondary | ICD-10-CM

## 2018-12-10 DIAGNOSIS — Z01419 Encounter for gynecological examination (general) (routine) without abnormal findings: Secondary | ICD-10-CM

## 2018-12-10 MED ORDER — IBUPROFEN 800 MG PO TABS: 800.0000 mg | ORAL_TABLET | Freq: Three times a day (TID) | ORAL | 3 refills | Status: DC | PRN

## 2018-12-10 NOTE — Patient Instructions (Signed)

## 2018-12-10 NOTE — Progress Notes (Signed)
48 y.o. [id number] Married  African American Fe here for annual exam. Contraception none. History of fibroids. Periods normal, monthly duration 5-6 days, lighter, some cramping. Rx Motrin  has helped with cramping, request  update. Has been seeing [facility] for labs and aex. She is not happy there now. Plans to seek new PCP.  Doesn't feel fibroids have changed since PUS in 06/2018. Was having pelvic pain, but PUS was normal here. Resolved. No other health issues today.  No LMP recorded.          Sexually active: Yes.    The current method of family planning is none, history of infertility. Exercising: No.  exercise Smoker:  no  Review of Systems  Constitutional: Negative.   HENT: Negative.   Eyes: Negative.   Respiratory: Negative.   Cardiovascular: Negative.   Gastrointestinal: Negative.   Genitourinary: Negative.   Musculoskeletal: Negative.   Skin: Negative.   Neurological: Negative.   Endo/Heme/Allergies: Negative.   Psychiatric/Behavioral: Negative.     Health Maintenance: Pap:  06-23-16 neg History of Abnormal Pap: no MMG:  09-03-18 category c density birads 1:neg Self Breast exams: occ Colonoscopy:  none BMD:   none TDaP:  2017 Shingles: no Pneumonia: no Hep C and HIV: both neg per patient Labs: if needed, with PCP   reports that she has never smoked. She has never used smokeless tobacco. She reports current alcohol use of about 2.0 standard drinks of alcohol per week. She reports that she does not use drugs.  Past Medical History:  Diagnosis Date  . Fibroid    5.5 cm possible partially submucous fibroid  . Hydrosalpinx 10/15/14   right    Past Surgical History:  Procedure Laterality Date  . mirena     removed 2013    Current Outpatient Medications  Medication Sig Dispense Refill  . Cholecalciferol (VITAMIN D PO) Take by mouth.    . IRON PO Take by mouth.    Marland Kitchen LORazepam (ATIVAN) 0.5 MG tablet Take 0.5 mg by mouth every 8 (eight) hours.     No current  facility-administered medications for this visit.     Family History  Problem Relation Age of Onset  . Heart disease Mother        CHF  . Hypertension Mother   . Diabetes Father   . Sarcoidosis Sister 40       deceased as complication of  . Heart attack Maternal Uncle   . Breast cancer Maternal Grandmother 12        ? death from breast cancer  . Hypertension Maternal Grandmother     ROS:  Pertinent items are noted in HPI.  Otherwise, a comprehensive ROS was negative.  Exam:   There were no vitals taken for this visit.   Ht Readings from Last 3 Encounters:  07/11/18 5' 5"  (1.651 m)  12/06/17 5' 4.75" (1.645 m)  06/05/17 5' 5.5" (1.664 m)    General appearance: alert, cooperative and appears stated age Head: Normocephalic, without obvious abnormality, atraumatic Neck: no adenopathy, supple, symmetrical, trachea midline and thyroid normal to inspection and palpation Lungs: clear to auscultation bilaterally Breasts: normal appearance, no masses or tenderness, No nipple retraction or dimpling, No nipple discharge or bleeding, No axillary or supraclavicular adenopathy Heart: regular rate and rhythm Abdomen: soft, non-tender; no masses,  no organomegaly Extremities: extremities normal, atraumatic, no cyanosis or edema Skin: Skin color, texture, turgor normal. No rashes or lesions Lymph nodes: Cervical, supraclavicular, and axillary nodes normal. No abnormal  inguinal nodes palpated Neurologic: Grossly normal   Pelvic: External genitalia:  no lesions              Urethra:  normal appearing urethra with no masses, tenderness or lesions              Bartholin's and Skene's: normal                 Vagina: normal appearing vagina with normal color and discharge, no lesions              Cervix: no cervical motion tenderness, no lesions and normal appearance              Pap taken: No. Bimanual Exam:  Uterus:  enlarged, 12-14 weeks size, history of multiple fibroids               Adnexa: normal adnexa and no mass, fullness, tenderness               Rectovaginal: Confirms               Anus:  normal sphincter tone, no lesions  Chaperone present: yes  A:  Well Woman with normal exam  Contraception history of infertility with fibroids  Multiple fibroids no uterine size change  Dysmenorrhea Motrin working well for management  PCP management of labs  P:   Reviewed health and wellness pertinent to exam  Discussed if menses changes or becomes heavier needs to advise.  Rx Motrin see order with instructions to take with food and not overuse with cramping.  Continue follow up with PCP as indicated  Pap smear: no   counseled on breast self exam, mammography screening, feminine hygiene, adequate intake of calcium and vitamin D, diet and exercise  return annually or prn  An After Visit Summary was printed and given to the patient.

## 2019-02-24 ENCOUNTER — Telehealth: Payer: Self-pay | Admitting: Obstetrics and Gynecology

## 2019-02-24 ENCOUNTER — Encounter: Payer: Self-pay | Admitting: Obstetrics and Gynecology

## 2019-02-24 NOTE — Telephone Encounter (Signed)
Spoke with patient, advised per Melvia Heaps, CNM. Patient verbalizes understanding and is agreeable.   Encounter closed.

## 2019-02-24 NOTE — Telephone Encounter (Signed)
She had pap with HPVHR in 2016 she should have pap every 3 years, (she had one in 2018 which was negative) and one with HPVHR every 5 years if no abnormal history.

## 2019-02-24 NOTE — Telephone Encounter (Signed)
Last PAP 06/23/16, negative.   Last AEX 12/10/18: no pap  Per review of OV notes, no Hx of abnormal pap.   Kathy Craig, CNM -please advise on next pap.

## 2019-02-24 NOTE — Telephone Encounter (Signed)
Patient sent the following correspondence through [facility].  I was just looking through my chart and it looks like my last Pap smear was in April 2018 was I'm not supposed to get one in September of this year when I had my physical please let me know.. thanks

## 2019-05-08 DIAGNOSIS — M545 Low back pain: Secondary | ICD-10-CM | POA: Diagnosis not present

## 2019-05-08 DIAGNOSIS — R109 Unspecified abdominal pain: Secondary | ICD-10-CM | POA: Diagnosis not present

## 2019-05-13 DIAGNOSIS — F329 Major depressive disorder, single episode, unspecified: Secondary | ICD-10-CM | POA: Diagnosis not present

## 2019-05-13 DIAGNOSIS — E559 Vitamin D deficiency, unspecified: Secondary | ICD-10-CM | POA: Diagnosis not present

## 2019-05-13 DIAGNOSIS — R300 Dysuria: Secondary | ICD-10-CM | POA: Diagnosis not present

## 2019-05-13 DIAGNOSIS — F419 Anxiety disorder, unspecified: Secondary | ICD-10-CM | POA: Diagnosis not present

## 2019-06-02 ENCOUNTER — Encounter: Payer: Self-pay | Admitting: Certified Nurse Midwife

## 2019-07-24 ENCOUNTER — Other Ambulatory Visit: Payer: Self-pay | Admitting: Obstetrics and Gynecology

## 2019-07-24 ENCOUNTER — Other Ambulatory Visit: Payer: Self-pay | Admitting: Certified Nurse Midwife

## 2019-07-24 DIAGNOSIS — Z1231 Encounter for screening mammogram for malignant neoplasm of breast: Secondary | ICD-10-CM

## 2019-09-11 DIAGNOSIS — Z1231 Encounter for screening mammogram for malignant neoplasm of breast: Secondary | ICD-10-CM

## 2019-11-26 DIAGNOSIS — Z0000 Encounter for general adult medical examination without abnormal findings: Secondary | ICD-10-CM | POA: Diagnosis not present

## 2019-11-26 DIAGNOSIS — R5383 Other fatigue: Secondary | ICD-10-CM | POA: Diagnosis not present

## 2019-11-26 DIAGNOSIS — Z1159 Encounter for screening for other viral diseases: Secondary | ICD-10-CM | POA: Diagnosis not present

## 2019-11-26 DIAGNOSIS — R7309 Other abnormal glucose: Secondary | ICD-10-CM

## 2019-11-26 DIAGNOSIS — D649 Anemia, unspecified: Secondary | ICD-10-CM

## 2019-11-26 DIAGNOSIS — Z131 Encounter for screening for diabetes mellitus: Secondary | ICD-10-CM | POA: Diagnosis not present

## 2019-11-26 DIAGNOSIS — Z114 Encounter for screening for human immunodeficiency virus [HIV]: Secondary | ICD-10-CM | POA: Diagnosis not present

## 2019-11-26 DIAGNOSIS — E559 Vitamin D deficiency, unspecified: Secondary | ICD-10-CM | POA: Diagnosis not present

## 2019-11-26 DIAGNOSIS — R7989 Other specified abnormal findings of blood chemistry: Secondary | ICD-10-CM

## 2019-11-26 HISTORY — DX: Other abnormal glucose: R73.09

## 2019-11-26 HISTORY — DX: Other specified abnormal findings of blood chemistry: R79.89

## 2019-11-26 HISTORY — DX: Anemia, unspecified: D64.9

## 2019-12-12 ENCOUNTER — Ambulatory Visit: Payer: BC Managed Care – PPO | Admitting: Certified Nurse Midwife

## 2020-01-23 ENCOUNTER — Telehealth: Payer: Self-pay

## 2020-01-23 NOTE — Telephone Encounter (Signed)
Patient is calling in regards to having irregular bleeding and pain on her side.

## 2020-01-23 NOTE — Telephone Encounter (Signed)
Left message to call Sharee Pimple, RN at [facility].   Last AEX w/ Melvia Heaps, CNM 12/10/18

## 2020-01-27 NOTE — Telephone Encounter (Signed)
Message left to return call to Triage Nurse at [phone].

## 2020-01-29 DIAGNOSIS — F419 Anxiety disorder, unspecified: Secondary | ICD-10-CM | POA: Diagnosis not present

## 2020-01-29 DIAGNOSIS — E559 Vitamin D deficiency, unspecified: Secondary | ICD-10-CM | POA: Diagnosis not present

## 2020-01-29 DIAGNOSIS — F32A Depression, unspecified: Secondary | ICD-10-CM | POA: Diagnosis not present

## 2020-01-29 DIAGNOSIS — G4700 Insomnia, unspecified: Secondary | ICD-10-CM | POA: Diagnosis not present

## 2020-02-03 NOTE — Telephone Encounter (Signed)
Patient states last menses heavy x 8 days. Began 01-22-20 and stopped 01-30-20. Had cramping and feels maybe fibroids have changed and requesting appointment. Made appointment with Dr.Silva on 02-10-20 at 10:00am. Advised she could make AEX appointment at that time.

## 2020-02-04 NOTE — Progress Notes (Signed)
GYNECOLOGY  VISIT   HPI: 49 y.o.   Married  [place] American  female   [phone] with Patient's last menstrual period was 01/22/2020.   here for heavy menses and lower pelvic discomfort. She states her bleeding is better now since she called. She has known fibroids and one fibroid that is encroaching on her endometrial cavity.   Discomfort started a a few months ago.  Light pain every day above the pubic bone.  Sex makes the pain worse.  Ibuprofen can help the pain but it does return.   PMP 01-05-20 heavy  LMP 01-22-20.  Lasted 6 days. Heavy with clots.   Uses a pad and changes every 3 hours.   Prior to October, her period was one month prior, and was shorted.   She does have bleeding in between her menstruation.   Has bladder pressure and low back pain.   She is tired.  No palpitations.   Had her physical exam and labs done 2 - 3 weeks ago, and she was dx with anemia through [facility].  She has started on fusion plus iron. She was told her had elevated A1C in prediabetic range.  Her magnesium was low.   She declines future pregnancies.   She does want a natural approach to her care.   GYNECOLOGIC HISTORY: Patient's last menstrual period was 01/22/2020. Contraception: None Menopausal hormone therapy: none Last mammogram: 09-03-18 3D/Neg/density C/Birads1 Last pap smear: 06-23-16 Neg, 04-20-14 Neg:Neg HR HPV        OB History    Gravida  3   Para  1   Term  1   Preterm      AB  2   Living  1     SAB      TAB  2   Ectopic      Multiple      Live Births  1              Patient Active Problem List   Diagnosis Date Noted  . Fibroids 07/07/2018  . Iron deficiency 02/14/2018    Past Medical History:  Diagnosis Date  . Fibroid    5.5 cm possible partially submucous fibroid  . Hydrosalpinx 10/15/14   right    Past Surgical History:  Procedure Laterality Date  . mirena     removed 2013    Current Outpatient Medications  Medication Sig  Dispense Refill  . chlorhexidine (PERIDEX) 0.12 % solution SMARTSIG:By Mouth    . CVS MAGNESIUM OXIDE 250 MG TABS Take 1 tablet by mouth daily.    . Fe Fum-Fe Poly-Vit C-Lactobac (FUSION) 65-65-25-30 MG CAPS Take 1 capsule by mouth daily.    . traZODone (DESYREL) 50 MG tablet Take 1 tablet by mouth at bedtime.    . Vitamin D, Ergocalciferol, (DRISDOL) 1.25 MG (50000 UNIT) CAPS capsule Take 50,000 Units by mouth once a week.    Marland Kitchen ibuprofen (ADVIL) 800 MG tablet Take 1 tablet (800 mg total) by mouth every 8 (eight) hours as needed for cramping. (Patient not taking: Reported on 02/10/2020) 30 tablet 3   No current facility-administered medications for this visit.     ALLERGIES: Patient has no known allergies.  Family History  Problem Relation Age of Onset  . Heart disease Mother        CHF  . Hypertension Mother   . Diabetes Father   . Sarcoidosis Sister 18       deceased as complication of  . Heart attack  Maternal Uncle   . Breast cancer Maternal Grandmother 23        ? death from breast cancer  . Hypertension Maternal Grandmother     Social History   Socioeconomic History  . Marital status: Married    Spouse name: Not on file  . Number of children: Not on file  . Years of education: Not on file  . Highest education level: Not on file  Occupational History  . Not on file  Tobacco Use  . Smoking status: Never Smoker  . Smokeless tobacco: Never Used  Substance and Sexual Activity  . Alcohol use: Yes    Alcohol/week: 2.0 standard drinks    Types: 2 Standard drinks or equivalent per week  . Drug use: No  . Sexual activity: Yes    Partners: Male    Birth control/protection: None  Other Topics Concern  . Not on file  Social History Narrative  . Not on file   Social Determinants of Health   Financial Resource Strain:   . Difficulty of Paying Living Expenses: Not on file  Food Insecurity:   . Worried About Charity fundraiser in the Last Year: Not on file  . Ran Out  of Food in the Last Year: Not on file  Transportation Needs:   . Lack of Transportation (Medical): Not on file  . Lack of Transportation (Non-Medical): Not on file  Physical Activity:   . Days of Exercise per Week: Not on file  . Minutes of Exercise per Session: Not on file  Stress:   . Feeling of Stress : Not on file  Social Connections:   . Frequency of Communication with Friends and Family: Not on file  . Frequency of Social Gatherings with Friends and Family: Not on file  . Attends Religious Services: Not on file  . Active Member of Clubs or Organizations: Not on file  . Attends Archivist Meetings: Not on file  . Marital Status: Not on file  Intimate Partner Violence:   . Fear of Current or Ex-Partner: Not on file  . Emotionally Abused: Not on file  . Physically Abused: Not on file  . Sexually Abused: Not on file    Review of Systems  Genitourinary: Positive for pelvic pain (low pelvic pain x2 months).  All other systems reviewed and are negative.   PHYSICAL EXAMINATION:    BP 100/70 (Cuff Size: Large)   Pulse 72   Ht 5' 4.75" (1.645 m)   Wt 155 lb (70.3 kg)   LMP 01/22/2020   SpO2 97%   BMI 25.99 kg/m     General appearance: alert, cooperative and appears stated age   Abdomen: soft, non-tender, mass to umbilicus minus 4 cm   Pelvic: External genitalia:  no lesions              Urethra:  normal appearing urethra with no masses, tenderness or lesions              Bartholins and Skenes: normal                 Vagina: normal appearing vagina with normal color and discharge, no lesions              Cervix: no lesions                Bimanual Exam:  Uterus:  16 week size, more right sided.  Adnexa: no mass, fullness, tenderness        Chaperone was present for exam.  ASSESSMENT  Fibroids, increased size of uterus.  Menorrhagia with irregular menses.  Anemia.   PLAN  Update mammogram.  Get labs.  Return for pelvic US and EMB. ACOG  HO on fibroids.  We discussed medical and procedural care for fibroids: hysterectomy, myomectomy, uterine artery embolization, radiofrequency treatment of fibroids, medical therapy.   31 min  total time was spent for this patient encounter, including preparation, face-to-face counseling with the patient, coordination of care, and documentation of the encounter.

## 2020-02-10 ENCOUNTER — Other Ambulatory Visit: Payer: Self-pay

## 2020-02-10 ENCOUNTER — Encounter: Payer: Self-pay | Admitting: Obstetrics and Gynecology

## 2020-02-10 ENCOUNTER — Ambulatory Visit (INDEPENDENT_AMBULATORY_CARE_PROVIDER_SITE_OTHER): Payer: BC Managed Care – PPO | Admitting: Obstetrics and Gynecology

## 2020-02-10 VITALS — BP 100/70 | HR 72 | Ht 64.75 in | Wt 155.0 lb

## 2020-02-10 DIAGNOSIS — D219 Benign neoplasm of connective and other soft tissue, unspecified: Secondary | ICD-10-CM

## 2020-02-10 DIAGNOSIS — N921 Excessive and frequent menstruation with irregular cycle: Secondary | ICD-10-CM

## 2020-02-10 NOTE — Patient Instructions (Signed)
Endometrial Biopsy  Endometrial biopsy is a procedure in which a tissue sample is taken from inside the uterus. The sample is taken from the endometrium, which is the lining of the uterus. The tissue sample is then checked under a microscope to see if the tissue is normal or abnormal. This procedure helps to determine where you are in your menstrual cycle and how hormone levels are affecting the lining of the uterus. This procedure may also be used to evaluate uterine bleeding or to diagnose endometrial cancer, endometrial tuberculosis, polyps, or other inflammatory conditions. Tell a health care provider about:  Any allergies you have.  All medicines you are taking, including vitamins, herbs, eye drops, creams, and over-the-counter medicines.  Any problems you or family members have had with anesthetic medicines.  Any blood disorders you have.  Any surgeries you have had.  Any medical conditions you have.  Whether you are pregnant or may be pregnant. What are the risks? Generally, this is a safe procedure. However, problems may occur, including:  Bleeding.  Pelvic infection.  Puncture of the wall of the uterus with the biopsy device (rare). What happens before the procedure?  Keep a record of your menstrual cycles as told by your health care provider. You may need to schedule your procedure for a specific time in your cycle.  You may want to bring a sanitary pad to wear after the procedure.  Ask your health care provider about: ? Changing or stopping your regular medicines. This is especially important if you are taking diabetes medicines or blood thinners. ? Taking medicines such as aspirin and ibuprofen. These medicines can thin your blood. Do not take these medicines before your procedure if your health care provider instructs you not to.  Plan to have someone take you home from the hospital or clinic. What happens during the procedure?  To lower your risk of  infection: ? Your health care team will wash or sanitize their hands.  You will lie on an exam table with your feet and legs supported as in a pelvic exam.  Your health care provider will insert an instrument (speculum) into your vagina to see your cervix.  Your cervix will be cleansed with an antiseptic solution.  A medicine (local anesthetic) will be used to numb the cervix.  A forceps instrument (tenaculum) will be used to hold your cervix steady for the biopsy.  A thin, rod-like instrument (uterine sound) will be inserted through your cervix to determine the length of your uterus and the location where the biopsy sample will be removed.  A thin, flexible tube (catheter) will be inserted through your cervix and into the uterus. The catheter will be used to collect the biopsy sample from your endometrial tissue.  The catheter and speculum will then be removed, and the tissue sample will be sent to a lab for examination. What happens after the procedure?  You will rest in a recovery area until you are ready to go home.  You may have mild cramping and a small amount of vaginal bleeding. This is normal.  It is up to you to get the results of your procedure. Ask your health care provider, or the department that is doing the procedure, when your results will be ready. Summary  Endometrial biopsy is a procedure in which a tissue sample is taken from the endometrium, which is the lining of the uterus.  This procedure may help to diagnose menstrual cycle problems, abnormal bleeding, or other conditions affecting  the endometrium.  Before the procedure, keep a record of your menstrual cycles as told by your health care provider.  The tissue sample that is removed will be checked under a microscope to see if it is normal or abnormal. This information is not intended to replace advice given to you by your health care provider. Make sure you discuss any questions you have with your health care  provider. Document Revised: 02/09/2017 Document Reviewed: 03/15/2016 Elsevier Patient Education  [place].

## 2020-02-11 DIAGNOSIS — R059 Cough, unspecified: Secondary | ICD-10-CM | POA: Diagnosis not present

## 2020-02-25 ENCOUNTER — Telehealth: Payer: Self-pay

## 2020-02-25 NOTE — Telephone Encounter (Signed)
Patient is returning call.  °

## 2020-02-25 NOTE — Telephone Encounter (Signed)
Call to patient. Per DPR, OK to leave message on voicemail.   Left voicemail requesting a return call to [facility] to go over benefits again for ultrasound appointment tomorrow.

## 2020-02-25 NOTE — Telephone Encounter (Signed)
Spoke with patient regarding questions with benefits for appointment tomorrow. Patient's questions answered.  Encounter closed.

## 2020-02-26 ENCOUNTER — Other Ambulatory Visit (HOSPITAL_COMMUNITY)
Admission: RE | Admit: 2020-02-26 | Discharge: 2020-02-26 | Disposition: A | Discharge status: Home / Self Care | Payer: BC Managed Care – PPO | Source: Ambulatory Visit | Attending: Obstetrics and Gynecology | Admitting: Obstetrics and Gynecology

## 2020-02-26 ENCOUNTER — Other Ambulatory Visit: Payer: Self-pay

## 2020-02-26 ENCOUNTER — Ambulatory Visit (INDEPENDENT_AMBULATORY_CARE_PROVIDER_SITE_OTHER): Payer: BC Managed Care – PPO | Admitting: Obstetrics and Gynecology

## 2020-02-26 ENCOUNTER — Ambulatory Visit (INDEPENDENT_AMBULATORY_CARE_PROVIDER_SITE_OTHER): Payer: BC Managed Care – PPO

## 2020-02-26 ENCOUNTER — Other Ambulatory Visit: Payer: Self-pay | Admitting: Obstetrics and Gynecology

## 2020-02-26 ENCOUNTER — Encounter: Payer: Self-pay | Admitting: Obstetrics and Gynecology

## 2020-02-26 DIAGNOSIS — N852 Hypertrophy of uterus: Secondary | ICD-10-CM

## 2020-02-26 DIAGNOSIS — N921 Excessive and frequent menstruation with irregular cycle: Secondary | ICD-10-CM

## 2020-02-26 DIAGNOSIS — D219 Benign neoplasm of connective and other soft tissue, unspecified: Secondary | ICD-10-CM | POA: Diagnosis not present

## 2020-02-26 NOTE — Patient Instructions (Signed)
Endometrial Biopsy, Care After This sheet gives you information about how to care for yourself after your procedure. Your health care provider may also give you more specific instructions. If you have problems or questions, contact your health care provider. What can I expect after the procedure? After the procedure, it is common to have:  Mild cramping.  A small amount of vaginal bleeding for a few days. This is normal. Follow these instructions at home:   Take over-the-counter and prescription medicines only as told by your health care provider.  Do not douche, use tampons, or have sexual intercourse until your health care provider approves.  Return to your normal activities as told by your health care provider. Ask your health care provider what activities are safe for you.  Follow instructions from your health care provider about any activity restrictions, such as restrictions on strenuous exercise or heavy lifting. Contact a health care provider if:  You have heavy bleeding, or bleed for longer than 2 days after the procedure.  You have bad smelling discharge from your vagina.  You have a fever or chills.  You have a burning sensation when urinating or you have difficulty urinating.  You have severe pain in your lower abdomen. Get help right away if:  You have severe cramps in your stomach or back.  You pass large blood clots.  Your bleeding increases.  You become weak or light-headed, or you pass out. Summary  After the procedure, it is common to have mild cramping and a small amount of vaginal bleeding for a few days.  Do not douche, use tampons, or have sexual intercourse until your health care provider approves.  Return to your normal activities as told by your health care provider. Ask your health care provider what activities are safe for you. This information is not intended to replace advice given to you by your health care provider. Make sure you discuss any  questions you have with your health care provider. Document Revised: 02/09/2017 Document Reviewed: 03/15/2016 Elsevier Patient Education  [place].

## 2020-02-26 NOTE — Progress Notes (Signed)
urg GYNECOLOGY  VISIT   HPI: 49 y.o.   Married  [place] American  female   [phone] with Kathy Craig's last menstrual period was 02/19/2020 (exact date).   here for PUS and EMB  Has known fibroid encroaching on her endometrial canal.   Kathy Craig is having heavy menses and intermenstrual bleeding. She is anemic documented through blood work at [facility].  Her uterus was 16 week size on 02/10/20 pelvic exam.   Declines further childbearing.   Would consider taking a birth control pill as she had done in the past.  Took LoEstrin.  She prefers to have a cycle every month.   Denies HTN, migraine with aura, liver or breast disease, personal or family history of thromboembolic events.  Not a smoker.   GYNECOLOGIC HISTORY: Kathy Craig's last menstrual period was 02/19/2020 (exact date). Contraception:  none Menopausal hormone therapy:  n/a Last mammogram: : 09-03-18 3D/Neg/density C/Birads1 Last pap smear: 06-23-16 Neg, 04-20-14 Neg:Neg HR HPV        OB History    Gravida  3   Para  1   Term  1   Preterm      AB  2   Living  1     SAB      IAB  2   Ectopic      Multiple      Live Births  1              Kathy Craig Active Problem List   Diagnosis Date Noted  . Fibroids 07/07/2018  . Iron deficiency 02/14/2018    Past Medical History:  Diagnosis Date  . Fibroid    5.5 cm possible partially submucous fibroid  . Hydrosalpinx 10/15/14   right    Past Surgical History:  Procedure Laterality Date  . mirena     removed 2013    Current Outpatient Medications  Medication Sig Dispense Refill  . chlorhexidine (PERIDEX) 0.12 % solution SMARTSIG:By Mouth    . CVS MAGNESIUM OXIDE 250 MG TABS Take 1 tablet by mouth daily.    . Fe Fum-Fe Poly-Vit C-Lactobac (FUSION) 65-65-25-30 MG CAPS Take 1 capsule by mouth daily.    Marland Kitchen ibuprofen (ADVIL) 800 MG tablet Take 1 tablet (800 mg total) by mouth every 8 (eight) hours as needed for cramping. 30 tablet 3  . traZODone (DESYREL) 50 MG  tablet Take 1 tablet by mouth at bedtime.    . Vitamin D, Ergocalciferol, (DRISDOL) 1.25 MG (50000 UNIT) CAPS capsule Take 50,000 Units by mouth once a week.     No current facility-administered medications for this visit.     ALLERGIES: Kathy Craig has no known allergies.  Family History  Problem Relation Age of Onset  . Heart disease Mother        CHF  . Hypertension Mother   . Diabetes Father   . Sarcoidosis Sister 2       deceased as complication of  . Heart attack Maternal Uncle   . Breast cancer Maternal Grandmother 69        ? death from breast cancer  . Hypertension Maternal Grandmother     Social History   Socioeconomic History  . Marital status: Married    Spouse name: Not on file  . Number of children: Not on file  . Years of education: Not on file  . Highest education level: Not on file  Occupational History  . Not on file  Tobacco Use  . Smoking status: Never Smoker  . Smokeless  tobacco: Never Used  Substance and Sexual Activity  . Alcohol use: Yes    Alcohol/week: 2.0 standard drinks    Types: 2 Standard drinks or equivalent per week  . Drug use: No  . Sexual activity: Yes    Partners: Male    Birth control/protection: None  Other Topics Concern  . Not on file  Social History Narrative  . Not on file   Social Determinants of Health   Financial Resource Strain: Not on file  Food Insecurity: Not on file  Transportation Needs: Not on file  Physical Activity: Not on file  Stress: Not on file  Social Connections: Not on file  Intimate Partner Violence: Not on file    Review of Systems  All other systems reviewed and are negative.   PHYSICAL EXAMINATION:    BP 110/76   Pulse 76   Ht 5' 4.75" (1.645 m)   Wt 155 lb (70.3 kg)   LMP 02/19/2020 (Exact Date)   SpO2 97%   BMI 25.99 kg/m     General appearance: alert, cooperative and appears stated age   Pelvic US Uterus with 7 fibroids, largest 5.39 cm.    EMS difficult to measure due to  encroaching fibroids. Ovaries normal.  No free fluid.  EMB Consent done.  Sterile prep with Hibiclens.  Paracervical block - 10 cc 1% lidocaine, lot [id number], exp 5/24.  Pipelle passed to 8 cm x 2.   Tissue to pathology.  No complications.  Minimal EBL.   Chaperone was present for exam.  ASSESSMENT  Fibroids.  Menorrhagia with irregular bleeding. Anemia.   PLAN  Consider Loestrin 1.5/30 after her mammogram is back and normal.  Will follow up EMB.  She declines procedural treatment of fibroids - [place], uterine artery embolization, hysterectomy.  Declines GNRH agonist or antagonist.  Will get a copy of her blood work from [facility].  38 minutes  total time was spent for this Kathy Craig encounter, including preparation, face-to-face counseling with the Kathy Craig, coordination of care, and documentation of the encounter.

## 2020-03-01 LAB — SURGICAL PATHOLOGY

## 2020-03-02 ENCOUNTER — Telehealth: Payer: Self-pay

## 2020-03-02 NOTE — Telephone Encounter (Signed)
Left message to call Estill Bamberg, CMA.

## 2020-03-02 NOTE — Telephone Encounter (Signed)
-----   Message from Nunzio Cobbs, MD sent at 03/02/2020 12:00 PM EST ----- Please let patient know that her endometrial biopsy is benign.  There is no evidence of polyps or abnormal cells.   After I get her mammogram back and if it is normal, I can prescribe her birth control pills.  Have her call me after her mammogram is done, so I can give her this prescription if she would like.

## 2020-03-14 DIAGNOSIS — R509 Fever, unspecified: Secondary | ICD-10-CM | POA: Diagnosis not present

## 2020-03-17 DIAGNOSIS — J0100 Acute maxillary sinusitis, unspecified: Secondary | ICD-10-CM | POA: Diagnosis not present

## 2020-03-25 ENCOUNTER — Other Ambulatory Visit: Payer: Self-pay

## 2020-03-25 DIAGNOSIS — N946 Dysmenorrhea, unspecified: Secondary | ICD-10-CM

## 2020-03-25 NOTE — Telephone Encounter (Signed)
Office visit with Dr. Quincy Simmonds 02/26/20.

## 2020-03-29 MED ORDER — IBUPROFEN 800 MG PO TABS: 800.0000 mg | ORAL_TABLET | Freq: Three times a day (TID) | ORAL | 1 refills | Status: DC | PRN

## 2020-03-30 DIAGNOSIS — U071 COVID-19: Secondary | ICD-10-CM | POA: Diagnosis not present

## 2020-04-07 ENCOUNTER — Emergency Department (HOSPITAL_BASED_OUTPATIENT_CLINIC_OR_DEPARTMENT_OTHER): Payer: BC Managed Care – PPO

## 2020-04-07 ENCOUNTER — Emergency Department (HOSPITAL_BASED_OUTPATIENT_CLINIC_OR_DEPARTMENT_OTHER)
Admission: EM | Admit: 2020-04-07 | Discharge: 2020-04-07 | Disposition: A | Discharge status: Home / Self Care | Payer: BC Managed Care – PPO | Attending: Emergency Medicine | Admitting: Emergency Medicine

## 2020-04-07 ENCOUNTER — Other Ambulatory Visit: Payer: Self-pay

## 2020-04-07 ENCOUNTER — Encounter (HOSPITAL_BASED_OUTPATIENT_CLINIC_OR_DEPARTMENT_OTHER): Payer: Self-pay

## 2020-04-07 DIAGNOSIS — U071 COVID-19: Secondary | ICD-10-CM | POA: Diagnosis not present

## 2020-04-07 DIAGNOSIS — R059 Cough, unspecified: Secondary | ICD-10-CM | POA: Diagnosis not present

## 2020-04-07 DIAGNOSIS — J189 Pneumonia, unspecified organism: Secondary | ICD-10-CM | POA: Diagnosis not present

## 2020-04-07 MED ORDER — GUAIFENESIN 100 MG/5ML PO SYRP: 100.0000 mg | ORAL_SOLUTION | ORAL | 0 refills | Status: DC | PRN

## 2020-04-07 MED ORDER — ALBUTEROL SULFATE HFA 108 (90 BASE) MCG/ACT IN AERS
1.0000 | INHALATION_SPRAY | Freq: Once | RESPIRATORY_TRACT | Status: AC
  Administered 2020-04-07: 2 via RESPIRATORY_TRACT
  Filled 2020-04-07: qty 6.7

## 2020-04-07 MED ORDER — BENZONATATE 100 MG PO CAPS: 100.0000 mg | ORAL_CAPSULE | Freq: Three times a day (TID) | ORAL | 0 refills | Status: DC

## 2020-04-07 NOTE — Discharge Instructions (Addendum)
Use albuterol inhalers, Tessalon Perles, cough medication for cough.  As we discussed, your chest x-ray showed some early signs of inflammation/possible early infection.Follow-up with the [facility].  Return to emergency department for any difficulty breathing, vomiting, chest pain or any other worsening concerning symptoms.

## 2020-04-07 NOTE — ED Provider Notes (Signed)
[facility] EMERGENCY DEPARTMENT Provider Note   CSN: [id number] Arrival date & time: 04/07/20  1547     History Chief Complaint  Patient presents with  . Covid Positive    Kathy Craig is a 50 y.o. female who presents for evaluation of persistent cough.  She reports about 8 days ago, she was diagnosed with COVID-19.  Her primary care doctor prescribed her prednisone and azithromycin which she has taken but states that despite that, she does not feel like she is getting any better.  She states she is still persistently coughing and is productive of phlegm.  No hemoptysis.  She reports when she coughs, her chest is sore and achy.  She also reports some difficulty breathing.  She states she has had some subjective fevers but has not measured any.  She called her primary care doctor today for follow-up and they sent her to emergency department for further evaluation.  She does not smoke.  No prior history of asthma or COPD.  The history is provided by the patient.       Past Medical History:  Diagnosis Date  . Fibroid    5.5 cm possible partially submucous fibroid  . Hydrosalpinx 10/15/14   right    Patient Active Problem List   Diagnosis Date Noted  . Fibroids 07/07/2018  . Iron deficiency 02/14/2018    Past Surgical History:  Procedure Laterality Date  . mirena     removed 2013     OB History    Gravida  3   Para  1   Term  1   Preterm      AB  2   Living  1     SAB      IAB  2   Ectopic      Multiple      Live Births  1           Family History  Problem Relation Age of Onset  . Heart disease Mother        CHF  . Hypertension Mother   . Diabetes Father   . Sarcoidosis Sister 57       deceased as complication of  . Heart attack Maternal Uncle   . Breast cancer Maternal Grandmother 20        ? death from breast cancer  . Hypertension Maternal Grandmother     Social History   Tobacco Use  . Smoking status: Never Smoker  .  Smokeless tobacco: Never Used  Vaping Use  . Vaping Use: Never used  Substance Use Topics  . Alcohol use: Not Currently  . Drug use: No    Home Medications Prior to Admission medications   Medication Sig Start Date End Date Taking? Authorizing Provider  benzonatate (TESSALON) 100 MG capsule Take 1 capsule (100 mg total) by mouth every 8 (eight) hours. 04/07/20  Yes Volanda Napoleon, PA-C  guaifenesin (ROBITUSSIN) 100 MG/5ML syrup Take 5-10 mLs (100-200 mg total) by mouth every 4 (four) hours as needed for cough. 04/07/20  Yes Volanda Napoleon, PA-C  chlorhexidine (PERIDEX) 0.12 % solution SMARTSIG:By Mouth 01/27/20   [provider]  [employer] MAGNESIUM OXIDE 250 MG TABS Take 1 tablet by mouth daily. 01/29/20   [provider]  Fe Fum-Fe Poly-Vit C-Lactobac (FUSION) 65-65-25-30 MG CAPS Take 1 capsule by mouth daily. 01/30/20   [provider]  ibuprofen (ADVIL) 800 MG tablet Take 1 tablet (800 mg total) by mouth every 8 (eight)  hours as needed for cramping. 03/29/20   Nunzio Cobbs, MD  traZODone (DESYREL) 50 MG tablet Take 1 tablet by mouth at bedtime. 01/29/20   [provider]  Vitamin D, Ergocalciferol, (DRISDOL) 1.25 MG (50000 UNIT) CAPS capsule Take 50,000 Units by mouth once a week. 01/29/20   [provider]    Allergies    Patient has no known allergies.  Review of Systems   Review of Systems  Constitutional: Positive for fever.  Respiratory: Positive for cough and shortness of breath.   Cardiovascular: Negative for chest pain.  Gastrointestinal: Negative for abdominal pain, nausea and vomiting.  Genitourinary: Negative for dysuria.  Neurological: Negative for headaches.  All other systems reviewed and are negative.   Physical Exam Updated Vital Signs BP 102/76 (BP Location: Right Arm)   Pulse (!) 105   Temp 98.4 F (36.9 C)   Resp 18   Ht 5' 5"  (1.651 m)   Wt 66.7 kg   LMP 03/09/2020   SpO2 100%   BMI 24.46  kg/m   Physical Exam Vitals and nursing note reviewed.  Constitutional:      Appearance: She is well-developed and well-nourished.  HENT:     Head: Normocephalic and atraumatic.  Eyes:     General: No scleral icterus.       Right eye: No discharge.        Left eye: No discharge.     Extraocular Movements: EOM normal.     Conjunctiva/sclera: Conjunctivae normal.  Pulmonary:     Effort: Pulmonary effort is normal.     Comments: Lungs clear to auscultation bilaterally.  Symmetric chest rise.  No wheezing, rales, rhonchi. Intermittently coughing but no respiratory distress. Able to speak in full sentences without any difficulty.  Skin:    General: Skin is warm and dry.  Neurological:     Mental Status: She is alert.  Psychiatric:        Mood and Affect: Mood and affect normal.        Speech: Speech normal.        Behavior: Behavior normal.     ED Results / Procedures / Treatments   Labs (all labs ordered are listed, but only abnormal results are displayed) Labs Reviewed - No data to display  EKG None  Radiology DG Chest Portable 1 View  Result Date: 04/07/2020 CLINICAL DATA:  Cough.  Recently diagnosed with COVID. EXAM: PORTABLE CHEST 1 VIEW COMPARISON:  06/05/2017 FINDINGS: The cardiomediastinal silhouette is unremarkable. Equivocal mild hazy opacity within the RIGHT LOWER lung noted which may represent infection/early pneumonia. There is no evidence of pulmonary edema, suspicious pulmonary nodule/mass, pleural effusion, or pneumothorax. No acute bony abnormalities are identified. IMPRESSION: Equivocal mild hazy opacity within the RIGHT LOWER lung which may represent infection/early pneumonia. No other significant abnormalities. Electronically Signed   By: Margarette Canada M.D.   On: 04/07/2020 18:21    Procedures Procedures   Medications Ordered in ED Medications  albuterol (VENTOLIN HFA) 108 (90 Base) MCG/ACT inhaler 1-2 puff (has no administration in time range)    ED  Course  I have reviewed the triage vital signs and the nursing notes.  Pertinent labs & imaging results that were available during my care of the patient were reviewed by me and considered in my medical decision making (see chart for details).    MDM Rules/Calculators/A&P  50 year old female who presents for evaluation of persistent cough.  Patient reports that she was diagnosed with COVID-19 8 days ago.  She states that she does not feel like she is getting any better and states that she has continued to have a cough.  PCP sent her here for further evaluation.  On initial arrival, she is afebrile, nontoxic-appearing.  Vital signs are stable.  On exam, she is intermittently coughing.  No evidence of respiratory distress.  Able speak in full sentences without any difficulty.  Will obtain chest x-ray, ambulate patient.  Chest x-ray shows mild hazy opacity within the right lower lung which may represent infection/early pneumonia.  Patient able to ambulate in the ED while maintaining O2 sats of 97%.  Patient is not hypoxic at this time.  I discussed results with patient.  I discussed with patient that this could be early Covid pneumonia.  At this time, she is well-appearing and stable for discharge.  We will send her home with cough medication, albuterol inhaler for symptomatic relief. At this time, patient exhibits no emergent life-threatening condition that require further evaluation in ED. Patient had ample opportunity for questions and discussion. All patient's questions were answered with full understanding. Strict return precautions discussed. Patient expresses understanding and agreement to plan.   Linn Clavin was evaluated in Emergency Department on 04/07/2020 for the symptoms described in the history of present illness. She was evaluated in the context of the global COVID-19 pandemic, which necessitated consideration that the patient might be at risk for infection with  the SARS-CoV-2 virus that causes COVID-19. Institutional protocols and algorithms that pertain to the evaluation of patients at risk for COVID-19 are in a state of rapid change based on information released by regulatory bodies including the CDC and federal and state organizations. These policies and algorithms were followed during the patient's care in the ED.  Portions of this note were generated with [employer]. Dictation errors may occur despite best attempts at proofreading.   Final Clinical Impression(s) / ED Diagnoses Final diagnoses:  COVID-19  Cough    Rx / DC Orders ED Discharge Orders         Ordered    benzonatate (TESSALON) 100 MG capsule  Every 8 hours        04/07/20 1910    guaifenesin (ROBITUSSIN) 100 MG/5ML syrup  Every 4 hours PRN        04/07/20 1910           Desma Mcgregor 04/07/20 1913    Lucrezia Starch, MD 04/09/20 2358

## 2020-04-07 NOTE — ED Triage Notes (Addendum)
Pt states she was dx with covid 8 days ago-c/o cont'd cough-NAD-steady gait

## 2020-04-07 NOTE — ED Notes (Signed)
Patient verbalizes understanding of discharge instructions. Opportunity for questioning and answers were provided. Armband removed by staff, pt discharged from ED ambulatory to home.  

## 2020-04-07 NOTE — ED Notes (Addendum)
Pt ambulated in room. O2 97-100%, HR 104-107. Pt denies dizziness, shob, endorses coughing

## 2020-04-14 ENCOUNTER — Telehealth: Payer: Self-pay | Admitting: Nurse Practitioner

## 2020-04-14 NOTE — Telephone Encounter (Signed)
[facility] [phone]) called pt for HFU. LVM for return call to schedule HFU appt.

## 2020-04-15 DIAGNOSIS — R0602 Shortness of breath: Secondary | ICD-10-CM | POA: Diagnosis not present

## 2020-04-15 DIAGNOSIS — R350 Frequency of micturition: Secondary | ICD-10-CM | POA: Diagnosis not present

## 2020-04-15 DIAGNOSIS — R059 Cough, unspecified: Secondary | ICD-10-CM | POA: Diagnosis not present

## 2020-04-15 DIAGNOSIS — J209 Acute bronchitis, unspecified: Secondary | ICD-10-CM | POA: Diagnosis not present

## 2020-04-15 DIAGNOSIS — Z79899 Other long term (current) drug therapy: Secondary | ICD-10-CM | POA: Diagnosis not present

## 2020-04-22 DIAGNOSIS — Z79899 Other long term (current) drug therapy: Secondary | ICD-10-CM | POA: Diagnosis not present

## 2020-04-22 DIAGNOSIS — J069 Acute upper respiratory infection, unspecified: Secondary | ICD-10-CM | POA: Diagnosis not present

## 2020-04-22 DIAGNOSIS — Z712 Person consulting for explanation of examination or test findings: Secondary | ICD-10-CM | POA: Diagnosis not present

## 2020-04-22 DIAGNOSIS — D539 Nutritional anemia, unspecified: Secondary | ICD-10-CM | POA: Diagnosis not present

## 2020-04-22 DIAGNOSIS — E559 Vitamin D deficiency, unspecified: Secondary | ICD-10-CM | POA: Diagnosis not present

## 2020-04-22 DIAGNOSIS — R059 Cough, unspecified: Secondary | ICD-10-CM | POA: Diagnosis not present

## 2020-04-22 DIAGNOSIS — R7303 Prediabetes: Secondary | ICD-10-CM | POA: Diagnosis not present

## 2020-04-22 DIAGNOSIS — G4700 Insomnia, unspecified: Secondary | ICD-10-CM | POA: Diagnosis not present

## 2020-05-23 ENCOUNTER — Encounter: Payer: Self-pay | Admitting: Obstetrics and Gynecology

## 2020-06-22 ENCOUNTER — Telehealth: Payer: Self-pay | Admitting: *Deleted

## 2020-06-22 NOTE — Telephone Encounter (Signed)
-----   Message from Nunzio Cobbs, MD sent at 03/02/2020 12:00 PM EST ----- Please let patient know that her endometrial biopsy is benign.  There is no evidence of polyps or abnormal cells.   After I get her mammogram back and if it is normal, I can prescribe her birth control pills.  Have her call me after her mammogram is done, so I can give her this prescription if she would like.

## 2020-06-22 NOTE — Telephone Encounter (Signed)
Burnice Logan, RN  06/22/2020 11:43 AM EDT Back to Top     Left message to call Sharee Pimple, RN at [facility], [phone].   Lowella Fairy, Sharon  03/02/2020 2:27 PM EST      Left message to call Estill Bamberg, CMA.

## 2020-06-22 NOTE — Telephone Encounter (Signed)
See 06/22/20 telephone encounter.   Encounter closed.

## 2020-07-13 NOTE — Telephone Encounter (Signed)
Patient will need a letter sent with her results please. It seems she has not picked up this message in her My Chart account from what I can detect.

## 2020-07-13 NOTE — Telephone Encounter (Signed)
No return call from patient.   MyChart message to patient.

## 2020-07-14 NOTE — Telephone Encounter (Signed)
MyChart message Last read by [name] at 8:37 PM on 07/13/2020.Marland Kitchen   Routing to provider for final review. . Will close encounter.

## 2020-08-30 ENCOUNTER — Other Ambulatory Visit: Payer: Self-pay

## 2020-08-30 ENCOUNTER — Encounter (HOSPITAL_BASED_OUTPATIENT_CLINIC_OR_DEPARTMENT_OTHER): Payer: Self-pay

## 2020-08-30 ENCOUNTER — Ambulatory Visit (HOSPITAL_BASED_OUTPATIENT_CLINIC_OR_DEPARTMENT_OTHER)
Admission: RE | Admit: 2020-08-30 | Discharge: 2020-08-30 | Disposition: A | Discharge status: Home / Self Care | Payer: BC Managed Care – PPO | Source: Ambulatory Visit | Attending: Obstetrics and Gynecology | Admitting: Obstetrics and Gynecology

## 2020-08-30 DIAGNOSIS — Z1231 Encounter for screening mammogram for malignant neoplasm of breast: Secondary | ICD-10-CM | POA: Diagnosis not present

## 2020-10-29 ENCOUNTER — Ambulatory Visit (INDEPENDENT_AMBULATORY_CARE_PROVIDER_SITE_OTHER): Payer: BC Managed Care – PPO | Admitting: Family

## 2020-10-29 ENCOUNTER — Other Ambulatory Visit: Payer: Self-pay

## 2020-10-29 ENCOUNTER — Encounter: Payer: Self-pay | Admitting: Family

## 2020-10-29 VITALS — BP 118/76 | HR 88 | Temp 98.7°F | Ht 60.0 in | Wt 169.6 lb

## 2020-10-29 DIAGNOSIS — Z8701 Personal history of pneumonia (recurrent): Secondary | ICD-10-CM | POA: Diagnosis not present

## 2020-10-29 DIAGNOSIS — G4700 Insomnia, unspecified: Secondary | ICD-10-CM | POA: Diagnosis not present

## 2020-10-29 DIAGNOSIS — Z1211 Encounter for screening for malignant neoplasm of colon: Secondary | ICD-10-CM

## 2020-10-29 MED ORDER — TRAZODONE HCL 100 MG PO TABS: 100.0000 mg | ORAL_TABLET | Freq: Every day | ORAL | 0 refills | Status: DC

## 2020-10-29 NOTE — Patient Instructions (Signed)
Plan to get your CXR at [place] location [facility])- go to the basement and you can get X-ray at your convenience; Open from 8 am - 5 pm;

## 2020-10-29 NOTE — Progress Notes (Signed)
Kathy Craig is a 50 y.o. female with the following history as recorded in EpicCare:  Patient Active Problem List   Diagnosis Date Noted   Fibroids 07/07/2018   Iron deficiency 02/14/2018    Current Outpatient Medications  Medication Sig Dispense Refill   ibuprofen (ADVIL) 800 MG tablet Take 1 tablet (800 mg total) by mouth every 8 (eight) hours as needed for cramping. 30 tablet 1   traZODone (DESYREL) 100 MG tablet Take 1 tablet (100 mg total) by mouth at bedtime. 90 tablet 0   No current facility-administered medications for this visit.    Allergies: Patient has no known allergies.  Past Craig History:  Diagnosis Date   Anemia 11/26/2019   10.1 - Kathy Craig   Elevated hemoglobin A1c 11/26/2019   5.9 - Kathy Craig    Fibroid    5.5 cm possible partially submucous fibroid   Hydrosalpinx 10/15/14   right   Low vitamin D level 11/26/2019   16.04 - Kathy Craig    Past Surgical History:  Procedure Laterality Date   mirena     removed 2013    Family History  Problem Relation Age of Onset   Heart disease Mother        CHF   Hypertension Mother    Diabetes Father    Sarcoidosis Sister 71       deceased as complication of   Heart attack Maternal Uncle    Breast cancer Maternal Grandmother 29        ? death from breast cancer   Hypertension Maternal Grandmother     Social History   Tobacco Use   Smoking status: Never   Smokeless tobacco: Never  Substance Use Topics   Alcohol use: Not Currently    Subjective:  Presents today as a new patient; transferring primary care needs from [facility]; Is up to date on GYN needs- Dr. Quincy Craig; struggles with complications from fibroids- responds to prescriptive Ibuprofen;  Would like to get colon cancer screening History of insomnia- currently on Trazodone 50 mg- limited benefit;     Objective:  Vitals:   10/29/20 1401  BP: 118/76  Pulse: 88  Temp: 98.7 F (37.1 C)  TempSrc: Oral  SpO2: 99%  Weight:  169 lb 9.6 oz (76.9 kg)  Height: 5' (1.524 m)    General: Well developed, well nourished, in no acute distress  Skin : Warm and dry.  Head: Normocephalic and atraumatic  Eyes: Sclera and conjunctiva clear; pupils round and reactive to light; extraocular movements intact  Ears: External normal; canals clear; tympanic membranes normal  Oropharynx: Pink, supple. No suspicious lesions  Neck: Supple without thyromegaly, adenopathy  Lungs: Respirations unlabored; clear to auscultation bilaterally without wheeze, rales, rhonchi  CVS exam: normal rate and regular rhythm.  Neurologic: Alert and oriented; speech intact; face symmetrical; moves all extremities well; CNII-XII intact without focal deficit   Assessment:  1. Colon cancer screening   2. History of pneumonia   3. Insomnia, unspecified type     Plan:  Order updated for Cologuard; Will update CXR to ensure resolution of pneumonia that developed secondary to Kathy Craig;  Increase Trazodone to 100 mg daily; will re-evaluate response at next appointment.  This visit occurred during the SARS-CoV-2 public health emergency.  Safety protocols were in place, including screening questions prior to the visit, additional usage of staff PPE, and extensive cleaning of exam room while observing appropriate contact time as indicated for disinfecting solutions.    Return in  about 1 month (around 11/29/2020) for for CPE.  Orders Placed This Encounter  Procedures   DG Chest 2 View    Standing Status:   Future    Standing Expiration Date:   10/29/2021    Order Specific Question:   Reason for Exam (SYMPTOM  OR DIAGNOSIS REQUIRED)    Answer:   History of pneumonia    Order Specific Question:   Is patient pregnant?    Answer:   No    Order Specific Question:   Preferred imaging location?    Answer:   Kathy Craig   Cologuard    Requested Prescriptions   Signed Prescriptions Disp Refills   traZODone (DESYREL) 100 MG tablet 90 tablet 0    Sig: Take 1  tablet (100 mg total) by mouth at bedtime.

## 2020-11-05 ENCOUNTER — Other Ambulatory Visit: Payer: Self-pay

## 2020-11-05 ENCOUNTER — Ambulatory Visit (INDEPENDENT_AMBULATORY_CARE_PROVIDER_SITE_OTHER)
Admission: RE | Admit: 2020-11-05 | Discharge: 2020-11-05 | Disposition: A | Discharge status: Home / Self Care | Payer: BC Managed Care – PPO | Source: Ambulatory Visit | Attending: Family | Admitting: Family

## 2020-11-05 DIAGNOSIS — Z8701 Personal history of pneumonia (recurrent): Secondary | ICD-10-CM | POA: Diagnosis not present

## 2020-11-05 DIAGNOSIS — J189 Pneumonia, unspecified organism: Secondary | ICD-10-CM | POA: Diagnosis not present

## 2020-11-09 DIAGNOSIS — Z1211 Encounter for screening for malignant neoplasm of colon: Secondary | ICD-10-CM | POA: Diagnosis not present

## 2020-11-17 LAB — COLOGUARD: Cologuard: NEGATIVE

## 2020-12-03 ENCOUNTER — Encounter: Payer: Self-pay | Admitting: Family

## 2020-12-03 ENCOUNTER — Other Ambulatory Visit: Payer: Self-pay

## 2020-12-03 ENCOUNTER — Ambulatory Visit (INDEPENDENT_AMBULATORY_CARE_PROVIDER_SITE_OTHER): Payer: BC Managed Care – PPO | Admitting: Family

## 2020-12-03 VITALS — BP 124/70 | HR 83 | Temp 98.2°F | Ht 65.5 in | Wt 172.4 lb

## 2020-12-03 DIAGNOSIS — G4700 Insomnia, unspecified: Secondary | ICD-10-CM | POA: Diagnosis not present

## 2020-12-03 DIAGNOSIS — Z0000 Encounter for general adult medical examination without abnormal findings: Secondary | ICD-10-CM | POA: Diagnosis not present

## 2020-12-03 DIAGNOSIS — Z1159 Encounter for screening for other viral diseases: Secondary | ICD-10-CM

## 2020-12-03 DIAGNOSIS — Z13220 Encounter for screening for lipoid disorders: Secondary | ICD-10-CM

## 2020-12-03 DIAGNOSIS — R7303 Prediabetes: Secondary | ICD-10-CM

## 2020-12-03 LAB — LIPID PANEL
Cholesterol: 223 mg/dL — ABNORMAL HIGH (ref 0–200)
HDL: 72.6 mg/dL (ref >39.00)
LDL Cholesterol: 139 mg/dL — ABNORMAL HIGH (ref 0–99)
NonHDL: 150.74
Total CHOL/HDL Ratio: 3
Triglycerides: 58 mg/dL (ref 0.0–149.0)
VLDL: 11.6 mg/dL (ref 0.0–40.0)

## 2020-12-03 LAB — COMPREHENSIVE METABOLIC PANEL
ALT: 9 U/L (ref 0–35)
AST: 16 U/L (ref 0–37)
Albumin: 4 g/dL (ref 3.5–5.2)
Alkaline Phosphatase: 64 U/L (ref 39–117)
BUN: 10 mg/dL (ref 6–23)
CO2: 27 mEq/L (ref 19–32)
Calcium: 9 mg/dL (ref 8.4–10.5)
Chloride: 104 mEq/L (ref 96–112)
Creatinine, Ser: 0.65 mg/dL (ref 0.40–1.20)
GFR: 103.26 mL/min (ref >60.00)
Glucose, Bld: 121 mg/dL — ABNORMAL HIGH (ref 70–99)
Potassium: 4.6 mEq/L (ref 3.5–5.1)
Sodium: 137 mEq/L (ref 135–145)
Total Bilirubin: 0.9 mg/dL (ref 0.2–1.2)
Total Protein: 7.3 g/dL (ref 6.0–8.3)

## 2020-12-03 LAB — CBC WITH DIFFERENTIAL/PLATELET
Basophils Absolute: 0 10*3/uL (ref 0.0–0.1)
Basophils Relative: 0.7 % (ref 0.0–3.0)
Eosinophils Absolute: 0.1 10*3/uL (ref 0.0–0.7)
Eosinophils Relative: 2.5 % (ref 0.0–5.0)
HCT: 37.3 % (ref 36.0–46.0)
Hemoglobin: 12.4 g/dL (ref 12.0–15.0)
Lymphocytes Relative: 16.3 % (ref 12.0–46.0)
Lymphs Abs: 0.9 10*3/uL (ref 0.7–4.0)
MCHC: 33.3 g/dL (ref 30.0–36.0)
MCV: 100.4 fl — ABNORMAL HIGH (ref 78.0–100.0)
Monocytes Absolute: 0.4 10*3/uL (ref 0.1–1.0)
Monocytes Relative: 7.1 % (ref 3.0–12.0)
Neutro Abs: 4 10*3/uL (ref 1.4–7.7)
Neutrophils Relative %: 73.4 % (ref 43.0–77.0)
Platelets: 257 10*3/uL (ref 150.0–400.0)
RBC: 3.72 Mil/uL — ABNORMAL LOW (ref 3.87–5.11)
RDW: 13.4 % (ref 11.5–15.5)
WBC: 5.4 10*3/uL (ref 4.0–10.5)

## 2020-12-03 LAB — TSH: TSH: 2.46 u[IU]/mL (ref 0.35–5.50)

## 2020-12-03 LAB — HEMOGLOBIN A1C: Hgb A1c MFr Bld: 5.7 % (ref 4.6–6.5)

## 2020-12-03 MED ORDER — ZOLPIDEM TARTRATE 5 MG PO TABS: 5.0000 mg | ORAL_TABLET | Freq: Every evening | ORAL | 1 refills | Status: DC | PRN

## 2020-12-03 NOTE — Progress Notes (Signed)
Kathy Craig is a 50 y.o. female with the following history as recorded in EpicCare:  Patient Active Problem List   Diagnosis Date Noted   Fibroids 07/07/2018   Iron deficiency 02/14/2018    Current Outpatient Medications  Medication Sig Dispense Refill   ibuprofen (ADVIL) 800 MG tablet Take 1 tablet (800 mg total) by mouth every 8 (eight) hours as needed for cramping. 30 tablet 1   traZODone (DESYREL) 100 MG tablet Take 1 tablet (100 mg total) by mouth at bedtime. 90 tablet 0   zolpidem (AMBIEN) 5 MG tablet Take 1 tablet (5 mg total) by mouth at bedtime as needed for sleep. 20 tablet 1   No current facility-administered medications for this visit.    Allergies: Patient has no known allergies.  Past Medical History:  Diagnosis Date   Anemia 11/26/2019   10.1 - [facility]   Elevated hemoglobin A1c 11/26/2019   5.9 - [facility]    Fibroid    5.5 cm possible partially submucous fibroid   Hydrosalpinx 10/15/14   right   Low vitamin D level 11/26/2019   16.04 - [facility]    Past Surgical History:  Procedure Laterality Date   mirena     removed 2013    Family History  Problem Relation Age of Onset   Heart disease Mother        CHF   Hypertension Mother    Diabetes Father    Sarcoidosis Sister 50       deceased as complication of   Heart attack Maternal Uncle    Breast cancer Maternal Grandmother 75        ? death from breast cancer   Hypertension Maternal Grandmother     Social History   Tobacco Use   Smoking status: Never   Smokeless tobacco: Never  Substance Use Topics   Alcohol use: Not Currently    Subjective:  Presents for yearly CPE; needs fasting labs; does see GYN regularly; up to date on mammogram and colon cancer screening;  Continuing to struggle with insomnia- no relief with increased dose of Trazodone; notes she has had issues with her sleep x 10-15 years; has tried Ambien but caused her to "be too carefree at work."   Review of Systems   Constitutional: Negative.   HENT: Negative.    Eyes: Negative.   Respiratory: Negative.    Cardiovascular: Negative.   Gastrointestinal: Negative.   Genitourinary: Negative.   Skin: Negative.   Neurological: Negative.   Endo/Heme/Allergies: Negative.   Psychiatric/Behavioral:  The patient has insomnia.       Objective:  Vitals:   12/03/20 0832  BP: 124/70  Pulse: 83  Temp: 98.2 F (36.8 C)  TempSrc: Oral  SpO2: 99%  Weight: 172 lb 6.4 oz (78.2 kg)  Height: 5' 5.5" (1.664 m)    General: Well developed, well nourished, in no acute distress  Skin : Warm and dry.  Head: Normocephalic and atraumatic  Eyes: Sclera and conjunctiva clear; pupils round and reactive to light; extraocular movements intact  Ears: External normal; canals clear; tympanic membranes normal  Oropharynx: Pink, supple. No suspicious lesions  Neck: Supple without thyromegaly, adenopathy  Lungs: Respirations unlabored; clear to auscultation bilaterally without wheeze, rales, rhonchi  CVS exam: normal rate and regular rhythm.  Abdomen: Soft; nontender; nondistended; normoactive bowel sounds; no masses or hepatosplenomegaly  Musculoskeletal: No deformities; no active joint inflammation  Extremities: No edema, cyanosis, clubbing  Vessels: Symmetric bilaterally  Neurologic: Alert and oriented;  speech intact; face symmetrical; moves all extremities well; CNII-XII intact without focal deficit   Assessment:  1. PE (physical exam), annual   2. Lipid screening   3. Insomnia, unspecified type   4. Need for hepatitis C screening test   5. Pre-diabetes     Plan:  Age appropriate preventive healthcare needs addressed; encouraged regular eye doctor and dental exams; encouraged regular exercise; will update labs and refills as needed today; follow-up to be determined; Short term Rx for Ambien 5 mg- she understands not to use with her Trazodone; refer to neurology for sleep study/ evaluation;  Follow up in 1 year,  sooner prn.  This visit occurred during the SARS-CoV-2 public health emergency.  Safety protocols were in place, including screening questions prior to the visit, additional usage of staff PPE, and extensive cleaning of exam room while observing appropriate contact time as indicated for disinfecting solutions.    No follow-ups on file.  Orders Placed This Encounter  Procedures   CBC with Differential/Platelet   Comp Met (CMET)   Lipid panel   TSH   Hepatitis C Antibody   Hemoglobin A1c   Ambulatory referral to Neurology    Referral Priority:   Routine    Referral Type:   Consultation    Referral Reason:   Specialty Services Required    Requested Specialty:   Neurology    Number of Visits Requested:   1    Requested Prescriptions   Signed Prescriptions Disp Refills   zolpidem (AMBIEN) 5 MG tablet 20 tablet 1    Sig: Take 1 tablet (5 mg total) by mouth at bedtime as needed for sleep.

## 2020-12-06 LAB — HEPATITIS C ANTIBODY
Hepatitis C Ab: NONREACTIVE
SIGNAL TO CUT-OFF: 0 (ref <1.00)

## 2021-01-24 ENCOUNTER — Other Ambulatory Visit: Payer: Self-pay | Admitting: Family

## 2021-04-23 ENCOUNTER — Other Ambulatory Visit: Payer: Self-pay | Admitting: Obstetrics and Gynecology

## 2021-04-23 DIAGNOSIS — N946 Dysmenorrhea, unspecified: Secondary | ICD-10-CM

## 2021-05-30 ENCOUNTER — Ambulatory Visit (INDEPENDENT_AMBULATORY_CARE_PROVIDER_SITE_OTHER): Payer: BC Managed Care – PPO | Admitting: Family Medicine

## 2021-05-30 ENCOUNTER — Encounter: Payer: Self-pay | Admitting: Family Medicine

## 2021-05-30 ENCOUNTER — Other Ambulatory Visit: Payer: Self-pay | Admitting: Family Medicine

## 2021-05-30 VITALS — BP 123/73 | HR 102 | Temp 98.3°F | Ht 65.5 in | Wt 171.4 lb

## 2021-05-30 DIAGNOSIS — J069 Acute upper respiratory infection, unspecified: Secondary | ICD-10-CM

## 2021-05-30 MED ORDER — FLUTICASONE PROPIONATE 50 MCG/ACT NA SUSP: 2.0000 | Freq: Every day | NASAL | 0 refills | Status: DC

## 2021-05-30 MED ORDER — PHENOL 1.4 % MT LIQD: 1.0000 | OROMUCOSAL | 1 refills | Status: DC | PRN

## 2021-05-30 MED ORDER — SALINE NASAL SPRAY 0.65 % NA SOLN
1.0000 | NASAL | 2 refills | Status: DC | PRN
Start: 2021-05-30 — End: 2021-11-04

## 2021-05-30 MED ORDER — BENZONATATE 200 MG PO CAPS: 200.0000 mg | ORAL_CAPSULE | Freq: Two times a day (BID) | ORAL | 0 refills | Status: DC | PRN

## 2021-05-30 MED ORDER — GUAIFENESIN ER 600 MG PO TB12: 1200.0000 mg | ORAL_TABLET | Freq: Two times a day (BID) | ORAL | 2 refills | Status: DC

## 2021-05-30 NOTE — Progress Notes (Signed)
? ?Acute Office Visit ? ?Subjective:  ? ? Patient ID: Kathy Craig, female    DOB: 1971-02-07, 51 y.o.   MRN: [id number] ? ?CC: URI ? ? ?URI  ?This is a new problem. Episode onset: 3-4 days ago. The problem has been unchanged. There has been no fever. Associated symptoms include congestion, coughing, headaches, a plugged ear sensation, rhinorrhea, sinus pain, sneezing and a sore throat. Pertinent negatives include no abdominal pain, chest pain, diarrhea, dysuria, ear pain, joint pain, joint swelling, nausea, neck pain, rash, swollen glands, vomiting or wheezing. Treatments tried: AlkaSeltzer cold/flu, cough drops. The treatment provided no relief.  ?Productive cough with green sputum, no hemoptysis. She did have nosebleed this morning that resolved with holding pressure. Negative home COVID test last night.  ? ? ? ?Past Medical History:  ?Diagnosis Date  ? Anemia 11/26/2019  ? 10.1 - [facility]  ? Elevated hemoglobin A1c 11/26/2019  ? 5.9 - [facility]   ? Fibroid   ? 5.5 cm possible partially submucous fibroid  ? Hydrosalpinx 10/15/14  ? right  ? Low vitamin D level 11/26/2019  ? 16.04 - [facility]  ? ? ?Past Surgical History:  ?Procedure Laterality Date  ? mirena    ? removed 2013  ? ? ?Family History  ?Problem Relation Age of Onset  ? Heart disease Mother   ?     CHF  ? Hypertension Mother   ? Diabetes Father   ? Sarcoidosis Sister 13  ?     deceased as complication of  ? Heart attack Maternal Uncle   ? Breast cancer Maternal Grandmother 27  ?      ? death from breast cancer  ? Hypertension Maternal Grandmother   ? ? ?Social History  ? ?Socioeconomic History  ? Marital status: Married  ?  Spouse name: Not on file  ? Number of children: Not on file  ? Years of education: Not on file  ? Highest education level: Not on file  ?Occupational History  ? Not on file  ?Tobacco Use  ? Smoking status: Never  ? Smokeless tobacco: Never  ?Vaping Use  ? Vaping Use: Never used  ?Substance and Sexual Activity  ?  Alcohol use: Not Currently  ? Drug use: No  ? Sexual activity: Not on file  ?Other Topics Concern  ? Not on file  ?Social History Narrative  ? Not on file  ? ?Social Determinants of Health  ? ?Financial Resource Strain: Not on file  ?Food Insecurity: Not on file  ?Transportation Needs: Not on file  ?Physical Activity: Not on file  ?Stress: Not on file  ?Social Connections: Not on file  ?Intimate Partner Violence: Not on file  ? ? ?Outpatient Medications Prior to Visit  ?Medication Sig Dispense Refill  ? ibuprofen (ADVIL) 800 MG tablet Take 1 tablet (800 mg total) by mouth every 8 (eight) hours as needed for cramping. 30 tablet 1  ? traZODone (DESYREL) 100 MG tablet TAKE 1 TABLET BY MOUTH EVERYDAY AT BEDTIME 90 tablet 0  ? zolpidem (AMBIEN) 5 MG tablet Take 1 tablet (5 mg total) by mouth at bedtime as needed for sleep. 20 tablet 1  ? ?No facility-administered medications prior to visit.  ? ? ?No Known Allergies ? ?Review of Systems ?All review of systems negative except what is listed in the HPI ? ?   ?Objective:  ?  ?Physical Exam ?Vitals reviewed.  ?Constitutional:   ?   Appearance: Normal appearance.  ?HENT:  ?  Head: Normocephalic and atraumatic.  ?   Nose: Congestion present.  ?   Mouth/Throat:  ?   Mouth: Mucous membranes are moist.  ?   Pharynx: Oropharynx is clear. No oropharyngeal exudate or posterior oropharyngeal erythema.  ?Eyes:  ?   Conjunctiva/sclera: Conjunctivae normal.  ?Cardiovascular:  ?   Rate and Rhythm: Normal rate and regular rhythm.  ?Pulmonary:  ?   Effort: Pulmonary effort is normal.  ?   Breath sounds: Normal breath sounds. No wheezing, rhonchi or rales.  ?Musculoskeletal:  ?   Cervical back: Normal range of motion and neck supple.  ?Skin: ?   General: Skin is warm and dry.  ?Neurological:  ?   General: No focal deficit present.  ?   Mental Status: She is alert and oriented to person, place, and time. Mental status is at baseline.  ?Psychiatric:     ?   Mood and Affect: Mood normal.     ?    Behavior: Behavior normal.     ?   Thought Content: Thought content normal.     ?   Judgment: Judgment normal.  ? ? ?BP 123/73   Pulse (!) 102   Temp 98.3 ?F (36.8 ?C)   Ht 5' 5.5" (1.664 m)   Wt 171 lb 6.4 oz (77.7 kg)   SpO2 100%   BMI 28.09 kg/m?  ?Wt Readings from Last 3 Encounters:  ?05/30/21 171 lb 6.4 oz (77.7 kg)  ?12/03/20 172 lb 6.4 oz (78.2 kg)  ?10/29/20 169 lb 9.6 oz (76.9 kg)  ? ? ?Health Maintenance Due  ?Topic Date Due  ? COVID-19 Vaccine (1) Never done  ? HIV Screening  Never done  ? PAP SMEAR-Modifier  06/24/2019  ? Zoster Vaccines- Shingrix (1 of 2) Never done  ? ? ?There are no preventive care reminders to display for this patient. ? ? ?Lab Results  ?Component Value Date  ? TSH 2.46 12/03/2020  ? ?Lab Results  ?Component Value Date  ? WBC 5.4 12/03/2020  ? HGB 12.4 12/03/2020  ? HCT 37.3 12/03/2020  ? MCV 100.4 (H) 12/03/2020  ? PLT 257.0 12/03/2020  ? ?Lab Results  ?Component Value Date  ? NA 137 12/03/2020  ? K 4.6 12/03/2020  ? CO2 27 12/03/2020  ? GLUCOSE 121 (H) 12/03/2020  ? BUN 10 12/03/2020  ? CREATININE 0.65 12/03/2020  ? BILITOT 0.9 12/03/2020  ? ALKPHOS 64 12/03/2020  ? AST 16 12/03/2020  ? ALT 9 12/03/2020  ? PROT 7.3 12/03/2020  ? ALBUMIN 4.0 12/03/2020  ? CALCIUM 9.0 12/03/2020  ? GFR 103.26 12/03/2020  ? ?Lab Results  ?Component Value Date  ? CHOL 223 (H) 12/03/2020  ? ?Lab Results  ?Component Value Date  ? HDL 72.60 12/03/2020  ? ?Lab Results  ?Component Value Date  ? LDLCALC 139 (H) 12/03/2020  ? ?Lab Results  ?Component Value Date  ? TRIG 58.0 12/03/2020  ? ?Lab Results  ?Component Value Date  ? CHOLHDL 3 12/03/2020  ? ?Lab Results  ?Component Value Date  ? HGBA1C 5.7 12/03/2020  ? ? ?   ?Assessment & Plan:  ? ?1. Viral URI with cough ?Continue OTC meds and supportive measures for a few more days. Sending in [place], Mucinex, Chloraseptic throat spray, Flonase (aware that this may cause some irritation and some people get nose bleeds), and saline nose spray (use  throughout the day to flush mucus membranes and keep moist). Continue supportive measures including rest, hydration, humidifier use, steam showers,  warm compresses to sinuses, warm liquids with lemon and honey, and over-the-counter cough, cold, and analgesics as needed.  Patient aware of signs/symptoms requiring further/urgent evaluation. (List of other OTC options added to AVS).  ? ?- guaiFENesin (MUCINEX) 600 MG 12 hr tablet; Take 2 tablets (1,200 mg total) by mouth 2 (two) times daily.  Dispense: 30 tablet; Refill: 2 ?- sodium chloride (OCEAN) 0.65 % nasal spray; Place 1 spray into the nose as needed for congestion.  Dispense: 15 mL; Refill: 2 ?- fluticasone (FLONASE) 50 MCG/ACT nasal spray; Place 2 sprays into both nostrils daily.  Dispense: 1 g; Refill: 0 ?- phenol (CHLORASEPTIC) 1.4 % LIQD; Use as directed 1 spray in the mouth or throat as needed for throat irritation / pain.  Dispense: 177 mL; Refill: 1 ?- benzonatate (TESSALON) 200 MG capsule; Take 1 capsule (200 mg total) by mouth 2 (two) times daily as needed for cough.  Dispense: 20 capsule; Refill: 0 ? ?If not improving by the end of the week or if significantly worse before then, let me know and we can discuss starting antibiotics.  ? ?Please contact office for follow-up if symptoms do not improve or worsen. Seek emergency care if symptoms become severe. ? ? ?Terrilyn Saver, NP ? ?

## 2021-05-30 NOTE — Patient Instructions (Addendum)
Continue supportive measures including rest, hydration, humidifier use, steam showers, warm compresses to sinuses, warm liquids with lemon and honey, and over-the-counter cough, cold, and analgesics as needed.  ?Vitamins C, D, zinc over-the-counter  ?If not improving by the end of the week or is significantly worse before then, let me know and we can discuss starting antibiotics.  ? ?Please contact office for follow-up if symptoms do not improve or worsen. Seek emergency care if symptoms become severe. ? ? ?Over the counter medications that may be helpful for symptoms: ? ?Guaifenesin 1200 mg extended release tabs twice daily, with plenty of water ?For cough and congestion ?Brand name: Mucinex   ?Pseudoephedrine 30 mg, one or two tabs every 4 to 6 hours ?For sinus congestion ?Brand name: Sudafed ?You must get this from the pharmacy counter.  ?Oxymetazoline nasal spray each morning, one spray in each nostril, for NO MORE THAN 3 days  ?For nasal and sinus congestion ?Brand name: Afrin ?Saline nasal spray or Saline Nasal Irrigation 3-5 times a day ?For nasal and sinus congestion ?Brand names: [place] or [place] ?Fluticasone nasal spray, one spray in each nostril, each morning (after oxymetazoline and saline, if used) ?For nasal and sinus congestion ?Brand name: Flonase ?Warm salt water gargles  ?For sore throat ?Every few hours as needed ?Alternate ibuprofen 400-600 mg and acetaminophen 1000 mg every 4-6 hours ?For fever, body aches, headache ?Brand names: Motrin or Advil and Tylenol ?Dextromethorphan 12-hour cough version 30 mg every 12 hours  ?For cough ?Brand name: Delsym ?Stop all other cold medications for now (Nyquil, Dayquil, Tylenol Cold, Theraflu, etc) and other non-prescription cough/cold preparations. Many of these have the same ingredients listed above and could cause an overdose of medication.  ? ?Herbal treatments that have been shown to be helpful in some patients include: ?Vitamin C 1062m per day ?Vitamin D  4000iU per day ?Zinc 1038mper day ?Quercetin 25-50054mwice a day ?Melatonin 5-10mg at bedtime ? ?General Instructions ?Allow your body to rest ?Drink PLENTY of fluids ? ? ?If you develop severe shortness of breath, uncontrolled fevers, coughing up blood, confusion, chest pain, or signs of dehydration (such as significantly decreased urine amounts or dizziness with standing) please go to the ER. ? ?

## 2021-05-30 NOTE — Progress Notes (Signed)
Symptoms started Fri ?Sore throat ?Headache ?Ear pressure ?Bloody nasal phlegm ?Cough ?Taking Alkaseltzer cold & flu ?

## 2021-06-07 ENCOUNTER — Other Ambulatory Visit: Payer: Self-pay | Admitting: Family

## 2021-06-07 NOTE — Telephone Encounter (Signed)
I have attempted to call pt with no success in reaching her. I have left a message to call back.  ?

## 2021-06-07 NOTE — Telephone Encounter (Signed)
We had referred her for sleep study due to continuing sleep issues. Was she able to follow up on this? How is she sleeping? Is the Trazodone beneficial?  ?

## 2021-06-09 NOTE — Telephone Encounter (Signed)
I have called pt and she stated that she is doing fine with the medication. She is sleeping from 10pm to 1-2 am with the help of the medication. Without it she is not sleeping at all. She declined the sleep study or looking into her sleep concern due to not having time for it right now. Pt stated that someone did try to call her a few months ago when the idea was first suggested but could not find time to do it then either.  ? ?Provider notified. And rx sent with a refill.  ?

## 2021-07-13 ENCOUNTER — Telehealth: Payer: Self-pay | Admitting: *Deleted

## 2021-07-13 NOTE — Telephone Encounter (Signed)
Left patient a message with all office information for scheduled appointment on 07/25/2021 at 2:10 with an 1:55 PM arrival time. ?

## 2021-07-19 ENCOUNTER — Ambulatory Visit: Payer: BC Managed Care – PPO | Admitting: Obstetrics and Gynecology

## 2021-07-25 ENCOUNTER — Encounter: Payer: BC Managed Care – PPO | Admitting: Obstetrics & Gynecology

## 2021-09-30 ENCOUNTER — Other Ambulatory Visit (HOSPITAL_BASED_OUTPATIENT_CLINIC_OR_DEPARTMENT_OTHER): Payer: Self-pay | Admitting: Family

## 2021-09-30 DIAGNOSIS — Z1231 Encounter for screening mammogram for malignant neoplasm of breast: Secondary | ICD-10-CM

## 2021-10-31 ENCOUNTER — Encounter (HOSPITAL_BASED_OUTPATIENT_CLINIC_OR_DEPARTMENT_OTHER): Payer: Self-pay

## 2021-10-31 ENCOUNTER — Ambulatory Visit (HOSPITAL_BASED_OUTPATIENT_CLINIC_OR_DEPARTMENT_OTHER)
Admission: RE | Admit: 2021-10-31 | Discharge: 2021-10-31 | Disposition: A | Discharge status: Home / Self Care | Payer: BC Managed Care – PPO | Source: Ambulatory Visit | Attending: Family | Admitting: Family

## 2021-10-31 DIAGNOSIS — Z1231 Encounter for screening mammogram for malignant neoplasm of breast: Secondary | ICD-10-CM | POA: Insufficient documentation

## 2021-11-04 ENCOUNTER — Other Ambulatory Visit (HOSPITAL_COMMUNITY)
Admission: RE | Admit: 2021-11-04 | Discharge: 2021-11-04 | Disposition: A | Discharge status: Home / Self Care | Payer: BC Managed Care – PPO | Source: Ambulatory Visit | Attending: Radiology | Admitting: Radiology

## 2021-11-04 ENCOUNTER — Ambulatory Visit (INDEPENDENT_AMBULATORY_CARE_PROVIDER_SITE_OTHER): Payer: BC Managed Care – PPO | Admitting: Radiology

## 2021-11-04 ENCOUNTER — Encounter: Payer: Self-pay | Admitting: Radiology

## 2021-11-04 VITALS — BP 118/84 | Ht 65.0 in | Wt 167.0 lb

## 2021-11-04 DIAGNOSIS — Z01419 Encounter for gynecological examination (general) (routine) without abnormal findings: Secondary | ICD-10-CM

## 2021-11-04 NOTE — Progress Notes (Signed)
   Kathy Craig Jul 29, 1970 [id number]   History:  51 y.o. G3P1 presents for annual exam. Hx of fibroid, menstrual bleeding is heavy but she has declined medical management. No gyn concerns otherwise.   Gynecologic History Patient's last menstrual period was 10/17/2021 (exact date). Period Cycle (Days): 28 Period Duration (Days): 7 Period Pattern: (!) Irregular (irregular spotting between periods) Menstrual Flow: Heavy (varies light to heavy) Menstrual Control: Maxi pad Dysmenorrhea: (!) Mild Dysmenorrhea Symptoms: Cramping Contraception/Family planning: none Sexually active: yes Last Pap: 2018. Results were: normal Last mammogram: 10/31/21. Results were: normal  Obstetric History OB History  Gravida Para Term Preterm AB Living  '3 1 1   2 1  '$ SAB IAB Ectopic Multiple Live Births    2     1    # Outcome Date GA Lbr Len/2nd Weight Sex Delivery Anes PTL Lv  3 IAB           2 IAB           1 Term     M Vag-Spont   LIV     The following portions of the patient's history were reviewed and updated as appropriate: allergies, current medications, past family history, past medical history, past social history, past surgical history, and problem list.  Review of Systems Pertinent items noted in HPI and remainder of comprehensive ROS otherwise negative.   Past medical history, past surgical history, family history and social history were all reviewed and documented in the EPIC chart.   Exam:  Vitals:   11/04/21 1111  BP: 118/84  Weight: 167 lb (75.8 kg)  Height: '5\' 5"'$  (1.651 m)   Body mass index is 27.79 kg/m.  General appearance:  Normal Thyroid:  Symmetrical, normal in size, without palpable masses or nodularity. Respiratory  Auscultation:  Clear without wheezing or rhonchi Cardiovascular  Auscultation:  Regular rate, without rubs, murmurs or gallops  Edema/varicosities:  Not grossly evident Abdominal  Soft,nontender, without masses, guarding or rebound.  Liver/spleen:   No organomegaly noted  Hernia:  None appreciated  Skin  Inspection:  Grossly normal Breasts: Examined lying and sitting.   Right: Without masses, retractions, nipple discharge or axillary adenopathy.   Left: Without masses, retractions, nipple discharge or axillary adenopathy. Genitourinary   Inguinal/mons:  Normal without inguinal adenopathy  External genitalia:  Normal appearing vulva with no masses, tenderness, or lesions  BUS/Urethra/Skene's glands:  Normal without masses or exudate  Vagina:  Normal appearing with normal color and discharge, no lesions  Cervix:  Normal appearing without discharge or lesions  Uterus:  enlarged 12 week size, fibroid palpated lower uterine segment.  Adnexa/parametria:     Rt: Normal in size, without masses or tenderness.   Lt: Normal in size, without masses or tenderness.  Anus and perineum: Normal   Patient informed chaperone available to be present for breast and pelvic exam. Patient has requested no chaperone to be present. Patient has been advised what will be completed during breast and pelvic exam.   Assessment/Plan:   1. Well woman exam with routine gynecological exam Pap Has a PCP  2. Fibroid Declines management  Discussed SBE, colonoscopy and DEXA screening as directed/appropriate. Recommend 1105mns of exercise weekly, including weight bearing exercise. Encouraged the use of seatbelts and sunscreen. Return in 1 year for annual or as needed.   CRubbie BattiestB WHNP-BC 11:27 AM 11/04/2021

## 2021-11-09 LAB — CYTOLOGY - PAP
Adequacy: ABSENT
Comment: NEGATIVE
Diagnosis: NEGATIVE
High risk HPV: NEGATIVE

## 2021-11-15 ENCOUNTER — Emergency Department (HOSPITAL_BASED_OUTPATIENT_CLINIC_OR_DEPARTMENT_OTHER)
Admission: EM | Admit: 2021-11-15 | Discharge: 2021-11-15 | Disposition: A | Discharge status: Home / Self Care | Payer: BC Managed Care – PPO | Attending: Emergency Medicine | Admitting: Emergency Medicine

## 2021-11-15 ENCOUNTER — Other Ambulatory Visit: Payer: Self-pay

## 2021-11-15 ENCOUNTER — Encounter (HOSPITAL_BASED_OUTPATIENT_CLINIC_OR_DEPARTMENT_OTHER): Payer: Self-pay | Admitting: Pediatrics

## 2021-11-15 ENCOUNTER — Emergency Department (HOSPITAL_BASED_OUTPATIENT_CLINIC_OR_DEPARTMENT_OTHER): Payer: BC Managed Care – PPO

## 2021-11-15 DIAGNOSIS — R109 Unspecified abdominal pain: Secondary | ICD-10-CM | POA: Insufficient documentation

## 2021-11-15 DIAGNOSIS — M549 Dorsalgia, unspecified: Secondary | ICD-10-CM | POA: Insufficient documentation

## 2021-11-15 DIAGNOSIS — N939 Abnormal uterine and vaginal bleeding, unspecified: Secondary | ICD-10-CM | POA: Insufficient documentation

## 2021-11-15 LAB — PREGNANCY, URINE: Preg Test, Ur: NEGATIVE

## 2021-11-15 LAB — URINALYSIS, MICROSCOPIC (REFLEX): RBC / HPF: >50 RBC/hpf (ref 0–5)

## 2021-11-15 LAB — BASIC METABOLIC PANEL
Anion gap: 8 (ref 5–15)
BUN: 12 mg/dL (ref 6–20)
CO2: 24 mmol/L (ref 22–32)
Calcium: 8.8 mg/dL — ABNORMAL LOW (ref 8.9–10.3)
Chloride: 104 mmol/L (ref 98–111)
Creatinine, Ser: 0.59 mg/dL (ref 0.44–1.00)
GFR, Estimated: >60 mL/min (ref >60)
Glucose, Bld: 135 mg/dL — ABNORMAL HIGH (ref 70–99)
Potassium: 4 mmol/L (ref 3.5–5.1)
Sodium: 136 mmol/L (ref 135–145)

## 2021-11-15 LAB — CBC WITH DIFFERENTIAL/PLATELET
Abs Immature Granulocytes: 0.02 10*3/uL (ref 0.00–0.07)
Basophils Absolute: 0 10*3/uL (ref 0.0–0.1)
Basophils Relative: 1 %
Eosinophils Absolute: 0.2 10*3/uL (ref 0.0–0.5)
Eosinophils Relative: 2 %
HCT: 36.2 % (ref 36.0–46.0)
Hemoglobin: 11.9 g/dL — ABNORMAL LOW (ref 12.0–15.0)
Immature Granulocytes: 0 %
Lymphocytes Relative: 18 %
Lymphs Abs: 1.5 10*3/uL (ref 0.7–4.0)
MCH: 30.7 pg (ref 26.0–34.0)
MCHC: 32.9 g/dL (ref 30.0–36.0)
MCV: 93.3 fL (ref 80.0–100.0)
Monocytes Absolute: 0.6 10*3/uL (ref 0.1–1.0)
Monocytes Relative: 7 %
Neutro Abs: 5.8 10*3/uL (ref 1.7–7.7)
Neutrophils Relative %: 72 %
Platelets: 260 10*3/uL (ref 150–400)
RBC: 3.88 MIL/uL (ref 3.87–5.11)
RDW: 13.3 % (ref 11.5–15.5)
WBC: 8.1 10*3/uL (ref 4.0–10.5)
nRBC: 0 % (ref 0.0–0.2)

## 2021-11-15 LAB — URINALYSIS, ROUTINE W REFLEX MICROSCOPIC

## 2021-11-15 LAB — HEPATIC FUNCTION PANEL
ALT: 11 U/L (ref 0–44)
AST: 16 U/L (ref 15–41)
Albumin: 3.3 g/dL — ABNORMAL LOW (ref 3.5–5.0)
Alkaline Phosphatase: 56 U/L (ref 38–126)
Bilirubin, Direct: 0.1 mg/dL (ref 0.0–0.2)
Indirect Bilirubin: 0.7 mg/dL (ref 0.3–0.9)
Total Bilirubin: 0.8 mg/dL (ref 0.3–1.2)
Total Protein: 6.9 g/dL (ref 6.5–8.1)

## 2021-11-15 LAB — LIPASE, BLOOD: Lipase: 36 U/L (ref 11–51)

## 2021-11-15 MED ORDER — KETOROLAC TROMETHAMINE 15 MG/ML IJ SOLN
15.0000 mg | Freq: Once | INTRAMUSCULAR | Status: AC
  Administered 2021-11-15: 15 mg via INTRAVENOUS
  Filled 2021-11-15: qty 1

## 2021-11-15 MED ORDER — DICLOFENAC SODIUM 75 MG PO TBEC: 75.0000 mg | DELAYED_RELEASE_TABLET | Freq: Two times a day (BID) | ORAL | 0 refills | Status: DC

## 2021-11-15 MED ORDER — IOHEXOL 300 MG/ML  SOLN
100.0000 mL | Freq: Once | INTRAMUSCULAR | Status: AC | PRN
  Administered 2021-11-15: 100 mL via INTRAVENOUS

## 2021-11-15 NOTE — ED Triage Notes (Signed)
C/O heavy menstrual cycles; having to change pads every 2 hours;

## 2021-11-15 NOTE — Discharge Instructions (Addendum)
Return if any problems.

## 2021-11-15 NOTE — ED Notes (Signed)
Patient transported to CT 

## 2021-11-16 NOTE — ED Provider Notes (Signed)
[facility] EMERGENCY DEPARTMENT Provider Note   CSN: [id number] Arrival date & time: 11/15/21  1849     History  Chief Complaint  Patient presents with   Vaginal Bleeding    Kathy Craig is a 51 y.o. female.  Pt complains of lower abdominal pain and back pain.  Pt reports she is on her menstrual cycle.  Pt reports using a pad every 2 hours.  Pt saw her Gyn on 8/25 and have a normal pap.  Pt reports she has a known fibroid  Pt reports she normallydoes not have back pain and cramping with periods.    The history is provided by the patient. No language interpreter was used.  Vaginal Bleeding Quality:  Bright red Severity:  Moderate Onset quality:  Gradual Timing:  Constant Chronicity:  Chronic Menstrual history:  Irregular Relieved by:  Nothing Worsened by:  Nothing Ineffective treatments:  None tried Associated symptoms: abdominal pain and back pain   Risk factors: no bleeding disorder and no STD exposure        Home Medications Prior to Admission medications   Medication Sig Start Date End Date Taking? Authorizing Provider  diclofenac (VOLTAREN) 75 MG EC tablet Take 1 tablet (75 mg total) by mouth 2 (two) times daily. 11/15/21  Yes Caryl Ada K, PA-C  fluticasone [facility] 50 MCG/ACT nasal spray SPRAY 2 SPRAYS INTO EACH NOSTRIL EVERY DAY Patient not taking: Reported on 11/04/2021 05/30/21   Terrilyn Saver, NP  traZODone (DESYREL) 100 MG tablet TAKE 1 TABLET BY MOUTH EVERYDAY AT BEDTIME 06/09/21   Marrian Salvage, FNP      Allergies    Patient has no known allergies.    Review of Systems   Review of Systems  Gastrointestinal:  Positive for abdominal pain.  Genitourinary:  Positive for vaginal bleeding.  Musculoskeletal:  Positive for back pain.  All other systems reviewed and are negative.   Physical Exam Updated Vital Signs BP 126/84 (BP Location: Left Arm)   Pulse 67   Temp 98 F (36.7 C) (Oral)   Resp 16   Ht '5\' 6"'$  (1.676 m)   Wt 76.7 kg    LMP 10/17/2021 (Exact Date)   SpO2 100%   BMI 27.28 kg/m  Physical Exam Vitals and nursing note reviewed.  Constitutional:      General: She is not in acute distress.    Appearance: She is well-developed.  HENT:     Head: Normocephalic and atraumatic.  Eyes:     Conjunctiva/sclera: Conjunctivae normal.  Cardiovascular:     Rate and Rhythm: Normal rate and regular rhythm.     Heart sounds: No murmur heard. Pulmonary:     Effort: Pulmonary effort is normal. No respiratory distress.     Breath sounds: Normal breath sounds.  Abdominal:     Palpations: Abdomen is soft.     Tenderness: There is no abdominal tenderness.  Musculoskeletal:        General: No swelling. Normal range of motion.     Cervical back: Neck supple.  Skin:    General: Skin is warm and dry.     Capillary Refill: Capillary refill takes less than 2 seconds.  Neurological:     Mental Status: She is alert.  Psychiatric:        Mood and Affect: Mood normal.     ED Results / Procedures / Treatments   Labs (all labs ordered are listed, but only abnormal results are displayed) Labs Reviewed  CBC WITH  DIFFERENTIAL/PLATELET - Abnormal; Notable for the following components:      Result Value   Hemoglobin 11.9 (*)    All other components within normal limits  BASIC METABOLIC PANEL - Abnormal; Notable for the following components:   Glucose, Bld 135 (*)    Calcium 8.8 (*)    All other components within normal limits  URINALYSIS, ROUTINE W REFLEX MICROSCOPIC - Abnormal; Notable for the following components:   Color, Urine RED (*)    APPearance TURBID (*)    Glucose, UA   (*)    Value: TEST NOT REPORTED DUE TO COLOR INTERFERENCE OF URINE PIGMENT   Hgb urine dipstick   (*)    Value: TEST NOT REPORTED DUE TO COLOR INTERFERENCE OF URINE PIGMENT   Bilirubin Urine   (*)    Value: TEST NOT REPORTED DUE TO COLOR INTERFERENCE OF URINE PIGMENT   Ketones, ur   (*)    Value: TEST NOT REPORTED DUE TO COLOR INTERFERENCE  OF URINE PIGMENT   Protein, ur   (*)    Value: TEST NOT REPORTED DUE TO COLOR INTERFERENCE OF URINE PIGMENT   Nitrite   (*)    Value: TEST NOT REPORTED DUE TO COLOR INTERFERENCE OF URINE PIGMENT   Leukocytes,Ua   (*)    Value: TEST NOT REPORTED DUE TO COLOR INTERFERENCE OF URINE PIGMENT   All other components within normal limits  HEPATIC FUNCTION PANEL - Abnormal; Notable for the following components:   Albumin 3.3 (*)    All other components within normal limits  URINALYSIS, MICROSCOPIC (REFLEX) - Abnormal; Notable for the following components:   Bacteria, UA MANY (*)    All other components within normal limits  PREGNANCY, URINE  LIPASE, BLOOD    EKG None  Radiology CT ABDOMEN PELVIS W CONTRAST  Result Date: 11/15/2021 CLINICAL DATA:  Abdomen pain EXAM: CT ABDOMEN AND PELVIS WITH CONTRAST TECHNIQUE: Multidetector CT imaging of the abdomen and pelvis was performed using the standard protocol following bolus administration of intravenous contrast. RADIATION DOSE REDUCTION: This exam was performed according to the departmental dose-optimization program which includes automated exposure control, adjustment of the mA and/or kV according to patient size and/or use of iterative reconstruction technique. CONTRAST:  153m OMNIPAQUE IOHEXOL 300 MG/ML  SOLN COMPARISON:  Pelvic ultrasound 02/26/2020 FINDINGS: Lower chest: No acute abnormality. Hepatobiliary: No focal liver abnormality is seen. No gallstones, or gallbladder wall thickening. Extrahepatic common bile duct appears enlarged measuring up to 14 mm. Pancreas: Unremarkable. No pancreatic ductal dilatation or surrounding inflammatory changes. Spleen: Normal in size without focal abnormality. Adrenals/Urinary Tract: Adrenal glands are unremarkable. Kidneys are normal, without renal calculi, focal lesion, or hydronephrosis. Bladder is unremarkable. Stomach/Bowel: Stomach is within normal limits. Appendix appears normal. No evidence of bowel wall  thickening, distention, or inflammatory changes. Diverticular disease of the colon without acute inflammatory process. Vascular/Lymphatic: No significant vascular findings are present. No enlarged abdominal or pelvic lymph nodes. Reproductive: Enlarged uterus. Calcified and noncalcified uterine masses probably representing fibroids though some of the masses are heterogeneous and poorly defined. The endometrial stripe cannot be identified with certainty. No adnexal mass Other: Negative for pelvic effusion or free air Musculoskeletal: No acute or significant osseous findings. IMPRESSION: 1. No CT evidence for acute intra-abdominal or pelvic abnormality 2. Dilated common bile duct up to 14 mm, correlate with LFTs. Follow-up MR or ERCP as indicated 3. Enlarged uterus with multiple calcified and noncalcified masses most likely fibroids however many of the hypodense masses are  ill-defined and the endometrial stripe cannot be identified with certainty. Endometrial thickening or mass is difficult to exclude on the basis of this exam. Electronically Signed   By: Donavan Foil M.D.   On: 11/15/2021 21:37    Procedures Procedures    Medications Ordered in ED Medications  iohexol (OMNIPAQUE) 300 MG/ML solution 100 mL (100 mLs Intravenous Contrast Given 11/15/21 2108)  ketorolac (TORADOL) 15 MG/ML injection 15 mg (15 mg Intravenous Given 11/15/21 2332)    ED Course/ Medical Decision Making/ A&P                           Medical Decision Making Pt complains of abdominal pain and back pain.  Pt is on her menstrual cycle.    Amount and/or Complexity of Data Reviewed External Data Reviewed: notes.    Details: Gynecology notes reviewed.   Labs: ordered. Decision-making details documented in ED Course.    Details: Labs ordered reviewed and interpreted.  Ua has large blood   Radiology: ordered and independent interpretation performed. Decision-making details documented in ED Course.    Details: Ct scan shows  fibroid.  Pt has a dilated common bile duct  Risk Prescription drug management. Risk Details: Pt given injection of toradol.  I advised pt to follow up with her gynecologist.  I advised follow up with GI and advised her to return if RUQ pain            Final Clinical Impression(s) / ED Diagnoses Final diagnoses:  Abnormal vaginal bleeding    Rx / DC Orders ED Discharge Orders          Ordered    diclofenac (VOLTAREN) 75 MG EC tablet  2 times daily        11/15/21 2333           An After Visit Summary was printed and given to the patient.    Fransico Meadow, PA-C 11/16/21 [place], [place], DO 11/18/21 2150

## 2021-11-18 NOTE — Progress Notes (Unsigned)
GYNECOLOGY  VISIT   HPI: 52 y.o.   Married  {Race/ethnicity:[id number]}  female   [phone] with Patient's last menstrual period was 10/17/2021 (exact date).   here for     GYNECOLOGIC HISTORY: Patient's last menstrual period was 10/17/2021 (exact date). Contraception:  ***None Menopausal hormone therapy:  *** Last mammogram:  10-31-21 Neg/BiRads1 Last pap smear:  11-04-21 Neg:Neg HR HPV, 06-23-16 Neg, 04-20-14 Neg:Neg HR HPV        OB History     Gravida  3   Para  1   Term  1   Preterm      AB  2   Living  1      SAB      IAB  2   Ectopic      Multiple      Live Births  1              Patient Active Problem List   Diagnosis Date Noted   Fibroids 07/07/2018   Iron deficiency 02/14/2018    Past Medical History:  Diagnosis Date   Anemia 11/26/2019   10.1 - [facility]   Elevated hemoglobin A1c 11/26/2019   5.9 - [facility]    Fibroid    5.5 cm possible partially submucous fibroid   Hydrosalpinx 10/15/14   right   Low vitamin D level 11/26/2019   16.04 - [facility]    Past Surgical History:  Procedure Laterality Date   mirena     removed 2013    Current Outpatient Medications  Medication Sig Dispense Refill   diclofenac (VOLTAREN) 75 MG EC tablet Take 1 tablet (75 mg total) by mouth 2 (two) times daily. 20 tablet 0   fluticasone (FLONASE) 50 MCG/ACT nasal spray SPRAY 2 SPRAYS INTO EACH NOSTRIL EVERY DAY (Patient not taking: Reported on 11/04/2021) 48 mL 0   traZODone (DESYREL) 100 MG tablet TAKE 1 TABLET BY MOUTH EVERYDAY AT BEDTIME 90 tablet 1   No current facility-administered medications for this visit.     ALLERGIES: Patient has no known allergies.  Family History  Problem Relation Age of Onset   Heart disease Mother        CHF   Hypertension Mother    Diabetes Father    Sarcoidosis Sister 70       deceased as complication of   Heart attack Maternal Uncle    Breast cancer Maternal Grandmother 57        ? death from breast  cancer   Hypertension Maternal Grandmother     Social History   Socioeconomic History   Marital status: Married    Spouse name: Not on file   Number of children: Not on file   Years of education: Not on file   Highest education level: Not on file  Occupational History   Not on file  Tobacco Use   Smoking status: Never   Smokeless tobacco: Never  Vaping Use   Vaping Use: Never used  Substance and Sexual Activity   Alcohol use: Yes    Comment: socially   Drug use: No   Sexual activity: Yes    Partners: Male    Birth control/protection: None  Other Topics Concern   Not on file  Social History Narrative   Not on file   Social Determinants of Health   Financial Resource Strain: Not on file  Food Insecurity: Not on file  Transportation Needs: Not on file  Physical Activity: Not on file  Stress:  Not on file  Social Connections: Not on file  Intimate Partner Violence: Not on file    Review of Systems  PHYSICAL EXAMINATION:    LMP 10/17/2021 (Exact Date)     General appearance: alert, cooperative and appears stated age Head: Normocephalic, without obvious abnormality, atraumatic Neck: no adenopathy, supple, symmetrical, trachea midline and thyroid normal to inspection and palpation Lungs: clear to auscultation bilaterally Breasts: normal appearance, no masses or tenderness, No nipple retraction or dimpling, No nipple discharge or bleeding, No axillary or supraclavicular adenopathy Heart: regular rate and rhythm Abdomen: soft, non-tender, no masses,  no organomegaly Extremities: extremities normal, atraumatic, no cyanosis or edema Skin: Skin color, texture, turgor normal. No rashes or lesions Lymph nodes: Cervical, supraclavicular, and axillary nodes normal. No abnormal inguinal nodes palpated Neurologic: Grossly normal  Pelvic: External genitalia:  no lesions              Urethra:  normal appearing urethra with no masses, tenderness or lesions               Bartholins and Skenes: normal                 Vagina: normal appearing vagina with normal color and discharge, no lesions              Cervix: no lesions                Bimanual Exam:  Uterus:  normal size, contour, position, consistency, mobility, non-tender              Adnexa: no mass, fullness, tenderness              Rectal exam: {yes no:[id number]}.  Confirms.              Anus:  normal sphincter tone, no lesions  Chaperone was present for exam:  ***  ASSESSMENT     PLAN     An After Visit Summary was printed and given to the patient.  ______ minutes face to face time of which over 50% was spent in counseling.

## 2021-11-21 ENCOUNTER — Ambulatory Visit: Payer: Self-pay | Admitting: Obstetrics and Gynecology

## 2021-11-21 ENCOUNTER — Encounter: Payer: Self-pay | Admitting: Obstetrics and Gynecology

## 2021-11-21 VITALS — BP 122/80 | HR 85 | Ht 65.0 in | Wt 167.0 lb

## 2021-11-21 DIAGNOSIS — D219 Benign neoplasm of connective and other soft tissue, unspecified: Secondary | ICD-10-CM

## 2021-11-21 DIAGNOSIS — N921 Excessive and frequent menstruation with irregular cycle: Secondary | ICD-10-CM

## 2021-11-21 NOTE — Patient Instructions (Signed)
Uterine Artery Embolization for Fibroids Uterine artery embolization is a procedure to shrink uterine fibroids. Uterine fibroids are abnormal growths of tissue (tumors) that can develop in the uterus and cause heavy menstrual bleeding and pain. This type of tumor is not cancerous (is benign). Fibroids can vary in size, shape, weight, and where they grow in the uterus. In this procedure, a small, thin tube (catheter) is used to inject tiny particle beads that block the blood supply to the fibroid. This causes the fibroid to shrink. Tell a health care provider about: Any allergies you have. All medicines you are taking, including vitamins, herbs, eye drops, creams, and over-the-counter medicines. Any problems you or family members have had with anesthetic medicines. Any blood disorders you have. Any surgeries you have had. Any medical conditions you have. Whether you are pregnant or may be pregnant. What are the risks? Generally, this is a safe procedure. However, problems may occur, including: Bleeding. Allergic reactions to medicines or dyes. Damage to nearby structures or organs. Infection, including blood infection. Injury to the uterus from decreased blood supply. Lack of menstrual periods. Other problems may occur, including: Death of tissue cells around your bladder or vulva. A hole that may develop between organs, or from an organ to the surface of your skin. Blood clot in the legs or lung. What happens before the procedure? Staying hydrated Follow instructions from your health care provider about hydration, which may include: Up to 2 hours before the procedure - you may continue to drink clear liquids, such as water, clear fruit juice, black coffee, and plain tea.  Eating and drinking restrictions Follow instructions from your health care provider about eating and drinking, which may include: 8 hours before the procedure - stop eating heavy meals or foods, such as meat, fried  foods, or fatty foods. 6 hours before the procedure - stop eating light meals or foods, such as toast or cereal. 6 hours before the procedure - stop drinking milk or drinks that contain milk. 2 hours before the procedure - stop drinking clear liquids. Medicines Ask your health care provider about: Changing or stopping your regular medicines. This is especially important if you are taking diabetes medicines or blood thinners. Taking over-the-counter medicines, vitamins, herbs, and supplements. Taking medicines such as aspirin and ibuprofen. These medicines can thin your blood. Do not take these medicines unless your health care provider tells you to take them. You may be given medicine to prevent nausea and vomiting. General instructions Do not use any products that contain nicotine or tobacco for at least 4 weeks before the procedure. These products include cigarettes, chewing tobacco, and vaping devices, such as e-cigarettes. If you need help quitting, ask your health care provider. Ask your health care provider: What steps will be taken to help prevent infection. These steps may include: Removing hair at the surgery site. Washing skin with a germ-killing soap. Taking antibiotic medicine. You will be asked to empty your bladder before the procedure. Plan to have a responsible adult take you home from the hospital or clinic. Plan to have a responsible adult care for you for the time you are told after you leave the hospital or clinic. This is important. What happens during the procedure?  An IV will be inserted into one of your veins. You will be given one or more of the following: A medicine to help you relax (sedative). A medicine to numb the area (local anesthetic). A small cut (incision) will be made in your  groin to find the main artery in your leg. A catheter will be inserted into the main artery in your leg and guided to your uterus. Images and X-rays will be taken while dye is  injected through the catheter. This shows the blood supply to your uterus and fibroids. Tiny particles, about the size of grains of sand, will be injected through the catheter and will lodge in tiny branches of the uterine artery. This will block the blood supply to the fibroids and cause them to shrink. Metal coils may also be used to help block the artery. The procedure will be repeated on the artery supplying blood to the other side of the uterus. The catheter will be removed and a bandage (dressing) will be placed over the incision. The procedure may vary among health care providers and hospitals. What can I expect after the procedure? Your blood pressure, heart rate, breathing rate, and blood oxygen level will be monitored until you leave the hospital or clinic. You will be given pain medicine as needed. You may be given medicine for nausea and vomiting as needed. If you were given a sedative during the procedure, it can affect you for several hours. Do not drive or operate machinery until your health care provider says it is safe. Summary Uterine artery embolization is a procedure to shrink uterine fibroids by blocking their blood supply. You may be given a medicine to help you relax (sedative) and a medicine to numb the area (local anesthetic) for the procedure. Tiny particles will be injected in the artery to your uterus to block blood flow and shrink the fibroid. After the procedure, you will be given medicine for pain and nausea as needed. If you were given a sedative during the procedure, it can affect you for several hours. Do not drive or operate machinery until your health care provider says it is safe. This information is not intended to replace advice given to you by your health care provider. Make sure you discuss any questions you have with your health care provider. Document Revised: 10/06/2019 Document Reviewed: 10/06/2019 Elsevier Patient Education  [place].   Total Laparoscopic Hysterectomy A total laparoscopic hysterectomy is a minimally invasive surgery to remove the uterus and cervix. The fallopian tubes and ovaries can also be removed during this surgery, if necessary. This procedure may be done to treat problems such as: Growths in the uterus (uterine fibroids) that are not cancer but cause symptoms. A condition that causes the lining of the uterus to grow in other areas (endometriosis). Problems with pelvic support. Cancer of the cervix, ovaries, uterus, or tissue that lines the uterus (endometrium). Excessive bleeding in the uterus. After this procedure, you will no longer be able to have a baby, and you will no longer have a menstrual period. Tell a health care provider about: Any allergies you have. All medicines you are taking, including vitamins, herbs, eye drops, creams, and over-the-counter medicines. Any problems you or family members have had with anesthetic medicines. Any blood disorders you have. Any surgeries you have had. Any medical conditions you have. Whether you are pregnant or may be pregnant. What are the risks? Generally, this is a safe procedure. However, problems may occur, including: Infection. Bleeding. Blood clots in the legs or lungs. Allergic reactions to medicines. Damage to nearby structures or organs. Having to change from this surgery to one in which a large incision is made in the abdomen (abdominal hysterectomy). What happens before the procedure? Staying  hydrated Follow instructions from your health care provider about hydration, which may include: Up to 2 hours before the procedure - you may continue to drink clear liquids, such as water, clear fruit juice, black coffee, and plain tea.  Eating and drinking restrictions Follow instructions from your health care provider about eating and drinking, which may include: 8 hours before the procedure - stop eating heavy meals or foods, such as  meat, fried foods, or fatty foods. 6 hours before the procedure - stop eating light meals or foods, such as toast or cereal. 6 hours before the procedure - stop drinking milk or drinks that contain milk. 2 hours before the procedure - stop drinking clear liquids. Medicines Take over-the-counter and prescription medicines only as told by your health care provider. You may be asked to take medicine that helps you have a bowel movement (laxative) to prevent constipation. General instructions If you were asked to do bowel preparation before the procedure, follow instructions from your health care provider. This procedure can affect the way you feel about yourself. Talk with your health care provider about the physical and emotional changes hysterectomy may cause. Do not use any products that contain nicotine or tobacco for at least 4 weeks before the procedure. These products include cigarettes, chewing tobacco, and vaping devices, such as e-cigarettes. If you need help quitting, ask your health care provider. Plan to have a responsible adult take you home from the hospital or clinic. Plan to have a responsible adult care for you for the time you are told after you leave the hospital or clinic. This is important. Surgery safety Ask your health care provider: How your surgery site will be marked. What steps will be taken to help prevent infection. These may include: Removing hair at the surgery site. Washing skin with a germ-killing soap. Receiving antibiotic medicine. What happens during the procedure? An IV will be inserted into one of your veins. You will be given one or more of the following: A medicine to help you relax (sedative). A medicine to make you fall asleep (general anesthetic). A medicine to numb the area (local anesthetic). A medicine that is injected into your spine to numb the area below and slightly above the injection site (spinal anesthetic). A medicine that is injected  into an area of your body to numb everything below the injection site (regional anesthetic). A gas will be used to inflate your abdomen. This will allow your surgeon to look inside your abdomen and do the surgery. Three or four small incisions will be made in your abdomen. A small device with a light (laparoscope) will be inserted into one of your incisions. Surgical instruments will be inserted through the other incisions in order to perform the procedure. Your uterus and cervix may be removed through your vagina or cut into small pieces and removed through the small incisions. Any other organs that need to be removed will also be removed this way. The gas will be released from inside your abdomen. Your incisions will be closed with stitches (sutures), skin glue, or adhesive strips. A bandage (dressing) may be placed over your incisions. The procedure may vary among health care providers and hospitals. What happens after the procedure? Your blood pressure, heart rate, breathing rate, and blood oxygen level will be monitored until you leave the hospital or clinic. You will be given medicine for pain as needed. You will be encouraged to walk as soon as possible. You will also use a device to  help you breathe or do breathing exercises to keep your lungs clear. You may have to wear compression stockings. These stockings help to prevent blood clots and reduce swelling in your legs. You will need to wear a sanitary pad for vaginal discharge or bleeding. Summary Total laparoscopic hysterectomy is a procedure to remove your uterus, cervix, and sometimes the fallopian tubes and ovaries. This procedure can affect the way you feel about yourself. Talk with your health care provider about the physical and emotional changes hysterectomy may cause. After this procedure, you will no longer be able to have a baby, and you will no longer have a menstrual period. You will be given pain medicine to control  discomfort after this procedure. Plan to have a responsible adult take you home from the hospital or clinic. This information is not intended to replace advice given to you by your health care provider. Make sure you discuss any questions you have with your health care provider. Document Revised: 05/11/2021 Document Reviewed: 10/31/2019 Elsevier Patient Education  [place].

## 2021-11-28 NOTE — Progress Notes (Unsigned)
GYNECOLOGY  VISIT   HPI: 51 y.o.   Married  [place] American  female   [phone] with Patient's last menstrual period was 11/14/2021 (approximate).   here for endometrial biopsy.    She has heavy and irregular menses.   She has known fibroids.   She is considering hysterectomy or birth control pills.   UPT negative.   GYNECOLOGIC HISTORY: Patient's last menstrual period was 11/14/2021 (approximate). Contraception:  None Menopausal hormone therapy:  none Last mammogram:   10-31-21 Neg/BiRads1 Last pap smear:  11-04-21 Neg:Neg HR HPV, 06-23-16 Neg, 04-20-14 Neg:Neg HR HPV        OB History     Gravida  3   Para  1   Term  1   Preterm      AB  2   Living  1      SAB      IAB  2   Ectopic      Multiple      Live Births  1              Patient Active Problem List   Diagnosis Date Noted   Fibroids 07/07/2018   Iron deficiency 02/14/2018    Past Medical History:  Diagnosis Date   Anemia 11/26/2019   10.1 - [facility]   Elevated hemoglobin A1c 11/26/2019   5.9 - [facility]    Fibroid    5.5 cm possible partially submucous fibroid   Hydrosalpinx 10/15/14   right   Low vitamin D level 11/26/2019   16.04 - [facility]    Past Surgical History:  Procedure Laterality Date   mirena     removed 2013    Current Outpatient Medications  Medication Sig Dispense Refill   Melatonin 10 MG CAPS Take 1 tablet by mouth as needed.     traZODone (DESYREL) 100 MG tablet TAKE 1 TABLET BY MOUTH EVERYDAY AT BEDTIME 90 tablet 1   No current facility-administered medications for this visit.     ALLERGIES: Patient has no known allergies.  Family History  Problem Relation Age of Onset   Heart disease Mother        CHF   Hypertension Mother    Diabetes Father    Sarcoidosis Sister 38       deceased as complication of   Heart attack Maternal Uncle    Breast cancer Maternal Grandmother 45        ? death from breast cancer   Hypertension Maternal  Grandmother     Social History   Socioeconomic History   Marital status: Married    Spouse name: Not on file   Number of children: Not on file   Years of education: Not on file   Highest education level: Not on file  Occupational History   Not on file  Tobacco Use   Smoking status: Never   Smokeless tobacco: Never  Vaping Use   Vaping Use: Never used  Substance and Sexual Activity   Alcohol use: Yes    Comment: socially   Drug use: No   Sexual activity: Yes    Partners: Male    Birth control/protection: None  Other Topics Concern   Not on file  Social History Narrative   Not on file   Social Determinants of Health   Financial Resource Strain: Not on file  Food Insecurity: Not on file  Transportation Needs: Not on file  Physical Activity: Not on file  Stress: Not on file  Social Connections: Not on file  Intimate Partner Violence: Not on file    Review of Systems  All other systems reviewed and are negative.   PHYSICAL EXAMINATION:    BP 110/64   Pulse 78   Ht '5\' 5"'$  (1.651 m)   Wt 167 lb (75.8 kg)   LMP 11/14/2021 (Approximate)   SpO2 98%   BMI 27.79 kg/m     General appearance: alert, cooperative and appears stated age   Pelvic: External genitalia:  no lesions              Urethra:  normal appearing urethra with no masses, tenderness or lesions              Bartholins and Skenes: normal                 Vagina: normal appearing vagina with normal color and discharge, no lesions              Cervix: no lesions                Bimanual Exam:  Uterus: 16 week size, mobile, non-tender              Adnexa: no mass, fullness, tenderness       EMB Consent done.  Sterile prep with Hibiclens.  Paracervical block 10 cc 1% lidocaine, lot [id number], exp 03/14/23.  Tenaculum to anterior cervical lip.  Pipelle passed to 7 cm x 2.  Tissue to pathology.  No complications.  Minimal EBL.  Chaperone was present for exam:  Estill Bamberg, CMA  ASSESSMENT  Menorrhagia  with irregular menses.  Fibroid uterus.   PLAN  Fu EMB.  We discussed possible medical therapy with Myfembree.  Written information also given the patient.  She understands this causes temporary menopause and can be used for 24 months total.  She would need to use nonhormonal contraceptive with use of this medication.  FU prn.    An After Visit Summary was printed and given to the patient.  20 min  total time was spent for this patient encounter, including preparation, face-to-face counseling with the patient, coordination of care, and documentation of the encounter in addition to performing the endometrial biopsy.

## 2021-11-29 ENCOUNTER — Encounter: Payer: Self-pay | Admitting: Obstetrics and Gynecology

## 2021-11-29 ENCOUNTER — Other Ambulatory Visit (HOSPITAL_COMMUNITY)
Admission: RE | Admit: 2021-11-29 | Discharge: 2021-11-29 | Disposition: A | Discharge status: Home / Self Care | Payer: BC Managed Care – PPO | Source: Ambulatory Visit | Attending: Obstetrics and Gynecology | Admitting: Obstetrics and Gynecology

## 2021-11-29 ENCOUNTER — Ambulatory Visit (INDEPENDENT_AMBULATORY_CARE_PROVIDER_SITE_OTHER): Payer: BC Managed Care – PPO | Admitting: Obstetrics and Gynecology

## 2021-11-29 VITALS — BP 110/64 | HR 78 | Ht 65.0 in | Wt 167.0 lb

## 2021-11-29 DIAGNOSIS — N921 Excessive and frequent menstruation with irregular cycle: Secondary | ICD-10-CM | POA: Insufficient documentation

## 2021-11-29 DIAGNOSIS — Z01812 Encounter for preprocedural laboratory examination: Secondary | ICD-10-CM | POA: Diagnosis not present

## 2021-11-29 LAB — PREGNANCY, URINE: Preg Test, Ur: NEGATIVE

## 2021-11-29 NOTE — Patient Instructions (Addendum)
Relugolix; Estradiol; Norethindrone Tablets What is this medication? RELUGOLIX; ESTRADIOL; NORETHINDRONE (rel loo GOE lix; es tra DYE ole; nor eth IN drone) helps reduce heavy periods caused by uterine fibroids. It may also be used to treat pain from endometriosis. Relugolix works by decreasing the amount of estrogen and other hormones your body makes, which reduces heavy bleeding and pain. Estradiol lowers the risk of bone loss caused by relugolix. Norethindrone helps lower the risk of cancer that can be caused by estradiol. This medication contains the hormones estrogen and progestin. This medicine may be used for other purposes; ask your health care provider or pharmacist if you have questions. COMMON BRAND NAME(S): Myfembree What should I tell my care team before I take this medication? They need to know if you have any of these conditions: Blood clots Breast, cervical, endometrial, or uterine cancer Diabetes Gallbladder disease Heart disease High blood pressure High cholesterol Kidney disease Liver disease Lupus Mental health condition Migraine headaches Osteoporosis, weak bones Porphyria Stroke Suicidal thoughts, plans, or attempt by you or a family member Tobacco use Unexplained vaginal bleeding An unusual or allergic reaction to relugolix, estrogens, progestins, other medications, foods, dyes, or preservatives Pregnant or trying to get pregnant Breastfeeding How should I use this medication? Take this medication by mouth with water. Take it as directed on the prescription label at the same time every day. You can take it with or without food. If it upsets your stomach, take it with food. Keep taking it unless your care team tells you to stop. A special MedGuide will be given to you by the pharmacist with each prescription and refill. Be sure to read this information carefully each time. Talk to your care team about the use of this medication in children. It is not approved for  use in children. Overdosage: If you think you have taken too much of this medicine contact a poison control center or emergency room at once. NOTE: This medicine is only for you. Do not share this medicine with others. What if I miss a dose? If you miss a dose, take it as soon as you can. If it is almost time for your next dose, take only that dose. Do not take double or extra doses. What may interact with this medication? Do not take this medication with any of the following: Aromatase inhibitors, such as aminoglutethimide, anastrozole, exemestane, letrozole, testolactone Cisapride Dronedarone Elagolix Pimozide Thioridazine This medication may also interact with the following: Certain antibiotics, such as erythromycin, clarithromycin, telithromycin Certain antivirals for HIV or hepatitis Certain medications for cancer treatment Certain medications for fungal infections, such as ketoconazole, itraconazole, posaconazole Certain medications for seizures, such as carbamazepine, phenobarbital, phenytoin Corticosteroids, such as hydrocortisone, prednisone, prednisolone Cyclosporine Grapefruit juice Medications for diabetes Mifepristone Other medications that cause heart rhythm changes Raloxifene Rifampin St. John's wort Tamoxifen Thyroid hormones Tranexamic acid Tricyclic antidepressants Verapamil Warfarin This list may not describe all possible interactions. Give your health care provider a list of all the medicines, herbs, non-prescription drugs, or dietary supplements you use. Also tell them if you smoke, drink alcohol, or use illegal drugs. Some items may interact with your medicine. What should I watch for while using this medication? Visit your care team for regular checks on your progress. You will need a regular breast and pelvic exam while on this medication. It may take several months to see improvement in your condition. You may have a change in bleeding pattern, irregular  periods, or may stop having periods while  taking this medication. Talk with your care team if you may be pregnant. Serious birth defects can occur if you take this medication during pregnancy and for 1 week after the last dose. Contraception is recommended while taking this medication and for 1 week after the last dose. Your care team can help you find the option that works for you. Talk to your care team if you use tobacco products. Changes to your treatment plan may be needed. Tobacco increases the risk of getting a blood clot or having a stroke while taking this medication. The risk is higher if you are over the age of 64. This medication can make your body retain fluid, making your fingers, hands, or ankles swell. Your blood pressure can go up. Contact your care team if you feel you are retaining fluid. Using this medication for a long time may weaken your bones. The risk of bone fractures may be increased. Talk to your care team about your bone health. If you are going to need surgery or other procedure, tell your care team that you are using this medication. What side effects may I notice from receiving this medication? Side effects that you should report to your care team as soon as possible: Allergic reactions--skin rash, itching, hives, swelling of the face, lips, tongue, or throat Blood clot--pain, swelling, or warmth in the leg, shortness of breath, chest pain Gallbladder problems--severe stomach pain, nausea, vomiting, fever Heavy vaginal bleeding Increase in blood pressure Liver injury--right upper belly pain, loss of appetite, nausea, light-colored stool, dark yellow or brown urine, yellowing skin or eyes, unusual weakness or fatigue Stroke--sudden numbness or weakness of the face, arm, or leg, trouble speaking, confusion, trouble walking, loss of balance or coordination, dizziness, severe headache, change in vision Thoughts of suicide or self-harm, worsening mood, or feelings of  depression Side effects that usually do not require medical attention (report these to your care team if they continue or are bothersome): Change in sex drive or performance Dizziness Fatigue Hair loss Headache Hot flashes This list may not describe all possible side effects. Call your doctor for medical advice about side effects. You may report side effects to FDA at 1-800-FDA-1088. Where should I keep my medication? Keep out of the reach of children and pets. Store between 15 and 30 degrees C (59 and 86 degrees F). Get rid of any unused medication after the expiration date.  Endometrial Biopsy  An endometrial biopsy is a procedure to remove tissue samples from the endometrium, which is the lining of the uterus. The tissue that is removed can then be checked under a microscope for disease. This procedure is used to diagnose conditions such as endometrial cancer, endometrial tuberculosis, polyps, or other inflammatory conditions. This procedure may also be used to investigate uterine bleeding to determine where you are in your menstrual cycle or how your hormone levels are affecting the lining of the uterus. Tell a health care provider about: Any allergies you have. All medicines you are taking, including vitamins, herbs, eye drops, creams, and over-the-counter medicines. Any problems you or family members have had with anesthetic medicines. Any bleeding problems you have. Any surgeries you have had. Any medical conditions you have. Whether you are pregnant or may be pregnant. What are the risks? Your health care provider will talk with you about risks. These may include: Bleeding. Pelvic infection. Puncture of the wall of the uterus with the biopsy device (rare). Allergic reactions to medicines. What happens before the procedure? Keep a  record of your menstrual cycles as told by your health care provider. You may need to schedule your procedure for a specific time in your  cycle. Bring a sanitary pad in case you need to wear one after the procedure. Ask your health care provider about: Changing or stopping your regular medicines. These include any diabetes medicines or blood thinners you take. Taking medicines such as aspirin and ibuprofen. These medicines can thin your blood. Do not take these medicines unless your health care provider tells you to. Taking over-the-counter medicines, vitamins, herbs, and supplements. Plan to have someone take you home from the hospital or clinic. What happens during the procedure? You will lie on an exam table with your feet and legs supported as in a pelvic exam. Your health care provider will insert an instrument into your vagina to see your cervix. Your cervix will be cleansed with an antiseptic solution. A medicine (local anesthetic) will be used to numb the cervix. A forceps instrument will be used to hold your cervix steady for the biopsy. A thin, rod-like instrument (uterine sound) will be inserted through your cervix to determine the length of your uterus and the location where the biopsy sample will be removed. A thin, flexible tube (catheter) will be inserted through your cervix and into the uterus. The catheter will be used to collect the biopsy sample from your endometrial tissue. The tube and instruments will be removed, and the tissue sample will be sent to a lab for examination. The procedure may vary among health care providers and hospitals. What happens after the procedure? Your blood pressure, heart rate, breathing rate, and blood oxygen level will be monitored until you leave the hospital or clinic. It is up to you to get the results of your procedure. Ask your health care provider, or the department that is doing the procedure, when your results will be ready. Summary An endometrial biopsy is a procedure to remove tissue samples from the endometrium, which is the lining of the uterus. This procedure is used  to diagnose conditions such as endometrial cancer, endometrial tuberculosis, polyps, or other inflammatory conditions. It is up to you to get the results of your procedure. Ask your health care provider, or the department that is doing the procedure, when your results will be ready. This information is not intended to replace advice given to you by your health care provider. Make sure you discuss any questions you have with your health care provider. Document Revised: 06/14/2021 Document Reviewed: 06/14/2021 Elsevier Patient Education  [place].  To get rid of medications that are no longer needed or have expired: Take the medication to a medication take-back program. Check with your pharmacy or law enforcement to find a location. If you cannot return the medication, check the label or package insert to see if the medication should be thrown out in the garbage or flushed down the toilet. If you are not sure, ask your care team. If it is safe to put it in the trash, empty the medication out of the container. Mix the medication with cat litter, dirt, coffee grounds, or other unwanted substance. Seal the mixture in a bag or container. Put it in the trash. NOTE: This sheet is a summary. It may not cover all possible information. If you have questions about this medicine, talk to your doctor, pharmacist, or health care provider.  2023 Elsevier/Gold Standard (2020-10-26 00:00:00)

## 2021-12-02 LAB — SURGICAL PATHOLOGY

## 2021-12-09 ENCOUNTER — Encounter: Payer: Self-pay | Admitting: Family

## 2021-12-09 ENCOUNTER — Ambulatory Visit (INDEPENDENT_AMBULATORY_CARE_PROVIDER_SITE_OTHER): Payer: BC Managed Care – PPO | Admitting: Family

## 2021-12-09 VITALS — BP 108/80 | HR 73 | Temp 98.2°F | Ht 65.0 in | Wt 166.2 lb

## 2021-12-09 DIAGNOSIS — R7309 Other abnormal glucose: Secondary | ICD-10-CM | POA: Diagnosis not present

## 2021-12-09 DIAGNOSIS — R200 Anesthesia of skin: Secondary | ICD-10-CM | POA: Diagnosis not present

## 2021-12-09 DIAGNOSIS — Z13220 Encounter for screening for lipoid disorders: Secondary | ICD-10-CM | POA: Diagnosis not present

## 2021-12-09 DIAGNOSIS — Z0000 Encounter for general adult medical examination without abnormal findings: Secondary | ICD-10-CM | POA: Diagnosis not present

## 2021-12-09 DIAGNOSIS — R202 Paresthesia of skin: Secondary | ICD-10-CM | POA: Diagnosis not present

## 2021-12-09 DIAGNOSIS — D649 Anemia, unspecified: Secondary | ICD-10-CM

## 2021-12-09 LAB — CBC WITH DIFFERENTIAL/PLATELET
Basophils Absolute: 0 10*3/uL (ref 0.0–0.1)
Basophils Relative: 0.7 % (ref 0.0–3.0)
Eosinophils Absolute: 0.1 10*3/uL (ref 0.0–0.7)
Eosinophils Relative: 2.2 % (ref 0.0–5.0)
HCT: 36.4 % (ref 36.0–46.0)
Hemoglobin: 12.1 g/dL (ref 12.0–15.0)
Lymphocytes Relative: 23 % (ref 12.0–46.0)
Lymphs Abs: 1.1 10*3/uL (ref 0.7–4.0)
MCHC: 33.3 g/dL (ref 30.0–36.0)
MCV: 96.2 fl (ref 78.0–100.0)
Monocytes Absolute: 0.4 10*3/uL (ref 0.1–1.0)
Monocytes Relative: 8 % (ref 3.0–12.0)
Neutro Abs: 3.2 10*3/uL (ref 1.4–7.7)
Neutrophils Relative %: 66.1 % (ref 43.0–77.0)
Platelets: 249 10*3/uL (ref 150.0–400.0)
RBC: 3.78 Mil/uL — ABNORMAL LOW (ref 3.87–5.11)
RDW: 14.3 % (ref 11.5–15.5)
WBC: 4.8 10*3/uL (ref 4.0–10.5)

## 2021-12-09 LAB — COMPREHENSIVE METABOLIC PANEL
ALT: 10 U/L (ref 0–35)
AST: 14 U/L (ref 0–37)
Albumin: 4.3 g/dL (ref 3.5–5.2)
Alkaline Phosphatase: 60 U/L (ref 39–117)
BUN: 12 mg/dL (ref 6–23)
CO2: 26 mEq/L (ref 19–32)
Calcium: 9.4 mg/dL (ref 8.4–10.5)
Chloride: 104 mEq/L (ref 96–112)
Creatinine, Ser: 0.71 mg/dL (ref 0.40–1.20)
GFR: 99.01 mL/min (ref >60.00)
Glucose, Bld: 117 mg/dL — ABNORMAL HIGH (ref 70–99)
Potassium: 4.6 mEq/L (ref 3.5–5.1)
Sodium: 139 mEq/L (ref 135–145)
Total Bilirubin: 0.9 mg/dL (ref 0.2–1.2)
Total Protein: 7.7 g/dL (ref 6.0–8.3)

## 2021-12-09 LAB — MAGNESIUM: Magnesium: 1.9 mg/dL (ref 1.5–2.5)

## 2021-12-09 LAB — LIPID PANEL
Cholesterol: 219 mg/dL — ABNORMAL HIGH (ref 0–200)
HDL: 69.6 mg/dL (ref >39.00)
LDL Cholesterol: 138 mg/dL — ABNORMAL HIGH (ref 0–99)
NonHDL: 149.36
Total CHOL/HDL Ratio: 3
Triglycerides: 55 mg/dL (ref 0.0–149.0)
VLDL: 11 mg/dL (ref 0.0–40.0)

## 2021-12-09 LAB — HEMOGLOBIN A1C: Hgb A1c MFr Bld: 5.8 % (ref 4.6–6.5)

## 2021-12-09 LAB — VITAMIN B12: Vitamin B-12: 693 pg/mL (ref 211–911)

## 2021-12-09 NOTE — Progress Notes (Signed)
Kathy Craig is a 51 y.o. female with the following history as recorded in Epic[employer]:  Patient Active Problem List   Diagnosis Date Noted   Fibroids 07/07/2018   Iron deficiency 02/14/2018    Current Outpatient Medications  Medication Sig Dispense Refill   Melatonin 10 MG CAPS Take 1 tablet by mouth as needed.     traZODone (DESYREL) 100 MG tablet TAKE 1 TABLET BY MOUTH EVERYDAY AT BEDTIME 90 tablet 1   No current facility-administered medications for this visit.    Allergies: Patient has no known allergies.  Past Medical History:  Diagnosis Date   Anemia 11/26/2019   10.1 - [facility]   Elevated hemoglobin A1c 11/26/2019   5.9 - [facility]    Fibroid    5.5 cm possible partially submucous fibroid   Hydrosalpinx 10/15/14   right   Low vitamin D level 11/26/2019   16.04 - [facility]    Past Surgical History:  Procedure Laterality Date   mirena     removed 2013    Family History  Problem Relation Age of Onset   Heart disease Mother        CHF   Hypertension Mother    Diabetes Father    Sarcoidosis Sister 72       deceased as complication of   Heart attack Maternal Uncle    Breast cancer Maternal Grandmother 77        ? death from breast cancer   Hypertension Maternal Grandmother     Social History   Tobacco Use   Smoking status: Never   Smokeless tobacco: Never  Substance Use Topics   Alcohol use: Yes    Comment: socially    Subjective:   Presents for yearly CPE; is struggling with fatigue/ heavy periods; considering hysterectomy; Also needs to review recent CT results from ER visit in September- question of bile duct being enlarged;   Does see eye doctor and dentist regularly;   Review of Systems  Constitutional:  Positive for malaise/fatigue.  HENT: Negative.    Eyes: Negative.   Respiratory: Negative.    Cardiovascular: Negative.   Gastrointestinal: Negative.   Genitourinary: Negative.   Musculoskeletal: Negative.   Skin: Negative.    Neurological:  Positive for tingling.  Endo/Heme/Allergies: Negative.   Psychiatric/Behavioral: Negative.         Objective:  Vitals:   12/09/21 0924  BP: 108/80  Pulse: 73  Temp: 98.2 F (36.8 C)  SpO2: 98%  Weight: 166 lb 3.2 oz (75.4 kg)  Height: $Remove'5\' 5"'TvvZCsl$  (1.651 m)    General: Well developed, well nourished, in no acute distress  Skin : Warm and dry.  Head: Normocephalic and atraumatic  Eyes: Sclera and conjunctiva clear; pupils round and reactive to light; extraocular movements intact  Ears: External normal; canals clear; tympanic membranes normal  Oropharynx: Pink, supple. No suspicious lesions  Neck: Supple without thyromegaly, adenopathy  Lungs: Respirations unlabored; clear to auscultation bilaterally without wheeze, rales, rhonchi  CVS exam: normal rate and regular rhythm.  Abdomen: Soft; nontender; nondistended; normoactive bowel sounds; no masses or hepatosplenomegaly  Musculoskeletal: No deformities; no active joint inflammation  Extremities: No edema, cyanosis, clubbing  Vessels: Symmetric bilaterally  Neurologic: Alert and oriented; speech intact; face symmetrical; moves all extremities well; CNII-XII intact without focal deficit   Assessment:  1. PE (physical exam), annual   2. Lipid screening   3. Elevated glucose   4. Numbness and tingling of both lower extremities   5. Anemia,  unspecified type     Plan:   Age appropriate preventive healthcare needs addressed; encouraged regular eye doctor and dental exams; encouraged regular exercise; will update labs and refills as needed today; follow-up to be determined; Suspect anemia is contributing to majority of her symptoms and she is considering hysterectomy; Will need to consider further abdominal imaging if LFTs are elevated;  Follow up to be determined;    No follow-ups on file.  Orders Placed This Encounter  Procedures   CBC with Differential/Platelet   Comp Met (CMET)   Lipid panel   Hemoglobin A1c    B12   Magnesium   Iron, TIBC and Ferritin Panel    Requested Prescriptions    No prescriptions requested or ordered in this encounter

## 2021-12-10 LAB — IRON,TIBC AND FERRITIN PANEL
%SAT: 14 % (calc) — ABNORMAL LOW (ref 16–45)
Ferritin: 12 ng/mL — ABNORMAL LOW (ref 16–232)
Iron: 52 ug/dL (ref 45–160)
TIBC: 359 mcg/dL (calc) (ref 250–450)

## 2021-12-12 ENCOUNTER — Other Ambulatory Visit: Payer: Self-pay | Admitting: Family

## 2021-12-12 DIAGNOSIS — D649 Anemia, unspecified: Secondary | ICD-10-CM

## 2021-12-13 ENCOUNTER — Other Ambulatory Visit (HOSPITAL_BASED_OUTPATIENT_CLINIC_OR_DEPARTMENT_OTHER): Payer: Self-pay

## 2021-12-13 MED ORDER — MELATONIN 10 MG PO CAPS
10.0000 mg | ORAL_CAPSULE | Freq: Every evening | ORAL | 2 refills | Status: DC | PRN
  Filled 2021-12-13 – 2022-01-23 (×2): qty 90, 90d supply, fill #0

## 2021-12-14 ENCOUNTER — Other Ambulatory Visit (HOSPITAL_BASED_OUTPATIENT_CLINIC_OR_DEPARTMENT_OTHER): Payer: Self-pay

## 2021-12-14 ENCOUNTER — Other Ambulatory Visit: Payer: Self-pay

## 2021-12-14 MED ORDER — MYFEMBREE 40-1-0.5 MG PO TABS: ORAL_TABLET | ORAL | 2 refills | Status: DC

## 2021-12-15 ENCOUNTER — Telehealth: Payer: Self-pay | Admitting: *Deleted

## 2021-12-15 NOTE — Telephone Encounter (Signed)
PA done via cover my meds for Myfembree. Medication approved through 12/15/2022

## 2021-12-29 ENCOUNTER — Other Ambulatory Visit (HOSPITAL_BASED_OUTPATIENT_CLINIC_OR_DEPARTMENT_OTHER): Payer: Self-pay

## 2021-12-30 ENCOUNTER — Other Ambulatory Visit: Payer: Self-pay | Admitting: Family

## 2021-12-30 DIAGNOSIS — D649 Anemia, unspecified: Secondary | ICD-10-CM

## 2022-01-02 ENCOUNTER — Inpatient Hospital Stay: Payer: BC Managed Care – PPO

## 2022-01-02 ENCOUNTER — Inpatient Hospital Stay: Payer: BC Managed Care – PPO | Admitting: Family

## 2022-01-23 ENCOUNTER — Inpatient Hospital Stay (HOSPITAL_BASED_OUTPATIENT_CLINIC_OR_DEPARTMENT_OTHER): Payer: BC Managed Care – PPO | Admitting: Family

## 2022-01-23 ENCOUNTER — Inpatient Hospital Stay: Payer: BC Managed Care – PPO | Attending: Hematology & Oncology

## 2022-01-23 ENCOUNTER — Other Ambulatory Visit (HOSPITAL_BASED_OUTPATIENT_CLINIC_OR_DEPARTMENT_OTHER): Payer: Self-pay

## 2022-01-23 ENCOUNTER — Encounter: Payer: Self-pay | Admitting: Family

## 2022-01-23 ENCOUNTER — Other Ambulatory Visit: Payer: Self-pay

## 2022-01-23 VITALS — BP 102/52 | HR 77 | Temp 98.1°F | Resp 18 | Ht 65.0 in | Wt 161.1 lb

## 2022-01-23 DIAGNOSIS — D649 Anemia, unspecified: Secondary | ICD-10-CM

## 2022-01-23 DIAGNOSIS — D509 Iron deficiency anemia, unspecified: Secondary | ICD-10-CM | POA: Insufficient documentation

## 2022-01-23 DIAGNOSIS — D500 Iron deficiency anemia secondary to blood loss (chronic): Secondary | ICD-10-CM

## 2022-01-23 LAB — CMP (CANCER CENTER ONLY)
ALT: 10 U/L (ref 0–44)
AST: 17 U/L (ref 15–41)
Albumin: 4.3 g/dL (ref 3.5–5.0)
Alkaline Phosphatase: 62 U/L (ref 38–126)
Anion gap: 7 (ref 5–15)
BUN: 13 mg/dL (ref 6–20)
CO2: 28 mmol/L (ref 22–32)
Calcium: 9.5 mg/dL (ref 8.9–10.3)
Chloride: 105 mmol/L (ref 98–111)
Creatinine: 0.75 mg/dL (ref 0.44–1.00)
GFR, Estimated: >60 mL/min (ref >60)
Glucose, Bld: 104 mg/dL — ABNORMAL HIGH (ref 70–99)
Potassium: 4.1 mmol/L (ref 3.5–5.1)
Sodium: 140 mmol/L (ref 135–145)
Total Bilirubin: 0.9 mg/dL (ref 0.3–1.2)
Total Protein: 7.9 g/dL (ref 6.5–8.1)

## 2022-01-23 LAB — SAVE SMEAR(SSMR), FOR PROVIDER SLIDE REVIEW

## 2022-01-23 LAB — CBC WITH DIFFERENTIAL (CANCER CENTER ONLY)
Abs Immature Granulocytes: 0.01 10*3/uL (ref 0.00–0.07)
Basophils Absolute: 0 10*3/uL (ref 0.0–0.1)
Basophils Relative: 1 %
Eosinophils Absolute: 0.2 10*3/uL (ref 0.0–0.5)
Eosinophils Relative: 3 %
HCT: 36.4 % (ref 36.0–46.0)
Hemoglobin: 11.5 g/dL — ABNORMAL LOW (ref 12.0–15.0)
Immature Granulocytes: 0 %
Lymphocytes Relative: 28 %
Lymphs Abs: 1.3 10*3/uL (ref 0.7–4.0)
MCH: 30 pg (ref 26.0–34.0)
MCHC: 31.6 g/dL (ref 30.0–36.0)
MCV: 95 fL (ref 80.0–100.0)
Monocytes Absolute: 0.4 10*3/uL (ref 0.1–1.0)
Monocytes Relative: 8 %
Neutro Abs: 2.8 10*3/uL (ref 1.7–7.7)
Neutrophils Relative %: 60 %
Platelet Count: 284 10*3/uL (ref 150–400)
RBC: 3.83 MIL/uL — ABNORMAL LOW (ref 3.87–5.11)
RDW: 12.6 % (ref 11.5–15.5)
WBC Count: 4.7 10*3/uL (ref 4.0–10.5)
nRBC: 0 % (ref 0.0–0.2)

## 2022-01-23 LAB — LACTATE DEHYDROGENASE: LDH: 152 U/L (ref 98–192)

## 2022-01-23 LAB — RETICULOCYTES
Immature Retic Fract: 10 % (ref 2.3–15.9)
RBC.: 3.79 MIL/uL — ABNORMAL LOW (ref 3.87–5.11)
Retic Count, Absolute: 71.6 10*3/uL (ref 19.0–186.0)
Retic Ct Pct: 1.9 % (ref 0.4–3.1)

## 2022-01-23 LAB — FERRITIN: Ferritin: 8 ng/mL — ABNORMAL LOW (ref 11–307)

## 2022-01-23 NOTE — Progress Notes (Signed)
Hematology/Oncology Consultation   Name: Kathy Craig      MRN: [id number]    Location: Room/bed info not found  Date: 01/23/2022 Time:3:06 PM   REFERRING PHYSICIAN: Sherlene Shams, FNP  REASON FOR CONSULT: Symptomatic anemia    DIAGNOSIS: Iron deficiency anemia   HISTORY OF PRESENT ILLNESS:  Kathy Craig is a very pleasant 51 yo African American female with history of iron deficiency anemia secondary to heavy cycles.  She has a regular cycle with heavy flow lasting up to 7 days. She has uterine fibroids.  Recent endometrial biopsy was negative.  She has occasional nose bleeds that she is able to stop in under 10 minutes.  She had 2 of her wisdom teeth removed without any complications.  No other blood loss noted. No bruising or petechiae.  She has received IV iron in the past and tolerated nicely. She denies any allergy or sensitivity.  She is symptomatic with fatigue and loss of appetite. She is doing her best to stay well hydrated.  Weight is stable at 161 lbs.  Her sister also has history of uterine fibroids and anemia.  She has 1 child, no history of miscarriage.  Her mammogram in August of this year was negative.  She has not yet had or scheduled a colonoscopy. She states that she has done the Cologuard (11/17/2020) and result was negative.  No personal or known familial history of cancer.  No history of diabetes or thyroid disease.  No fever, chills, n/v, cough, rash, dizziness, SOB, chest pain, palpitations, abdominal pain or changes in bowel or bladder habits.  No swelling, tenderness, numbness or tingling in her extremities. She has occasional cramping in her feet.  No falls or syncope reported.  No smoking, ETOH or recreational drug use.  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   ROS: All other 10 point review of systems is negative.   PAST MEDICAL HISTORY:   Past Medical History:  Diagnosis Date   Anemia 11/26/2019   10.1 - [facility]   Elevated hemoglobin A1c  11/26/2019   5.9 - [facility]    Fibroid    5.5 cm possible partially submucous fibroid   Hydrosalpinx 10/15/14   right   Low vitamin D level 11/26/2019   16.04 - [facility]    ALLERGIES: No Known Allergies    MEDICATIONS:  Current Outpatient Medications on File Prior to Visit  Medication Sig Dispense Refill   Melatonin 10 MG CAPS Take 1 tablet by mouth as needed. 90 capsule 2   No current facility-administered medications on file prior to visit.     PAST SURGICAL HISTORY Past Surgical History:  Procedure Laterality Date   mirena     removed 2013    FAMILY HISTORY: Family History  Problem Relation Age of Onset   Heart disease Mother        CHF   Hypertension Mother    Diabetes Father    Sarcoidosis Sister 4       deceased as complication of   Heart attack Maternal Uncle    Breast cancer Maternal Grandmother 87        ? death from breast cancer   Hypertension Maternal Grandmother     SOCIAL HISTORY:  reports that she has never smoked. She has never used smokeless tobacco. She reports current alcohol use. She reports that she does not use drugs.  PERFORMANCE STATUS: The patient's performance status is 1 - Symptomatic but completely ambulatory  PHYSICAL  EXAM: Most Recent Vital Signs: Blood pressure (!) 102/52, pulse 77, temperature 98.1 F (36.7 C), temperature source Oral, resp. rate 18, height '5\' 5"'$  (1.651 m), weight 161 lb 1.9 oz (73.1 kg), SpO2 100 %. BP (!) 102/52 (BP Location: Left Arm, Patient Position: Sitting)   Pulse 77   Temp 98.1 F (36.7 C) (Oral)   Resp 18   Ht '5\' 5"'$  (1.651 m)   Wt 161 lb 1.9 oz (73.1 kg)   SpO2 100%   BMI 26.81 kg/m   General Appearance:    Alert, cooperative, no distress, appears stated age  Head:    Normocephalic, without obvious abnormality, atraumatic  Eyes:    PERRL, conjunctiva/corneas clear, EOM's intact, fundi    benign, both eyes        Throat:   Lips, mucosa, and tongue normal; teeth and gums normal   Neck:   Supple, symmetrical, trachea midline, no adenopathy;    thyroid:  no enlargement/tenderness/nodules; no carotid   bruit or JVD  Back:     Symmetric, no curvature, ROM normal, no CVA tenderness  Lungs:     Clear to auscultation bilaterally, respirations unlabored  Chest Wall:    No tenderness or deformity   Heart:    Regular rate and rhythm, S1 and S2 normal, no murmur, rub   or gallop     Abdomen:     Soft, non-tender, bowel sounds active all four quadrants,    no masses, no organomegaly        Extremities:   Extremities normal, atraumatic, no cyanosis or edema  Pulses:   2+ and symmetric all extremities  Skin:   Skin color, texture, turgor normal, no rashes or lesions  Lymph nodes:   Cervical, supraclavicular, and axillary nodes normal  Neurologic:   CNII-XII intact, normal strength, sensation and reflexes    throughout    LABORATORY DATA:  Results for orders placed or performed in visit on 01/23/22 (from the past 48 hour(s))  CBC with Differential ([place] Only)     Status: Abnormal   Collection Time: 01/23/22  2:43 PM  Result Value Ref Range   WBC Count 4.7 4.0 - 10.5 K/uL   RBC 3.83 (L) 3.87 - 5.11 MIL/uL   Hemoglobin 11.5 (L) 12.0 - 15.0 g/dL   HCT 36.4 36.0 - 46.0 %   MCV 95.0 80.0 - 100.0 fL   MCH 30.0 26.0 - 34.0 pg   MCHC 31.6 30.0 - 36.0 g/dL   RDW 12.6 11.5 - 15.5 %   Platelet Count 284 150 - 400 K/uL   nRBC 0.0 0.0 - 0.2 %   Neutrophils Relative % 60 %   Neutro Abs 2.8 1.7 - 7.7 K/uL   Lymphocytes Relative 28 %   Lymphs Abs 1.3 0.7 - 4.0 K/uL   Monocytes Relative 8 %   Monocytes Absolute 0.4 0.1 - 1.0 K/uL   Eosinophils Relative 3 %   Eosinophils Absolute 0.2 0.0 - 0.5 K/uL   Basophils Relative 1 %   Basophils Absolute 0.0 0.0 - 0.1 K/uL   Immature Granulocytes 0 %   Abs Immature Granulocytes 0.01 0.00 - 0.07 K/uL    Comment: Performed at [facility] at [facility], [address], [place], [place] [address]  Save  Smear for Provider Slide Review     Status: None   Collection Time: 01/23/22  2:43 PM  Result Value Ref Range   Smear Review SMEAR STAINED AND AVAILABLE  FOR REVIEW     Comment: Performed at [facility] at [facility], [address], [place], [place] [address]  Reticulocytes     Status: Abnormal   Collection Time: 01/23/22  2:44 PM  Result Value Ref Range   Retic Ct Pct 1.9 0.4 - 3.1 %   RBC. 3.79 (L) 3.87 - 5.11 MIL/uL   Retic Count, Absolute 71.6 19.0 - 186.0 K/uL   Immature Retic Fract 10.0 2.3 - 15.9 %    Comment: Performed at [facility] at [facility], [address] [place],  [address]      RADIOGRAPHY: No results found.     PATHOLOGY: None  ASSESSMENT/PLAN: Ms. Cullifer is a very pleasant 51 yo African American female with history of iron deficiency anemia secondary to heavy cycles.  Anemia work up is pending.  We will get her set up for IV iron if needed.  She may also benefit from daily folic acid.  Follow-up in 2 months.   All questions were answered. The patient knows to call the clinic with any problems, questions or concerns. We can certainly see the patient much sooner if necessary.   Lottie Dawson, NP

## 2022-01-24 ENCOUNTER — Other Ambulatory Visit: Payer: Self-pay | Admitting: Family

## 2022-01-24 LAB — IRON AND IRON BINDING CAPACITY (CC-WL,HP ONLY)
Iron: 49 ug/dL (ref 28–170)
Saturation Ratios: 12 % (ref 10.4–31.8)
TIBC: 414 ug/dL (ref 250–450)
UIBC: 365 ug/dL (ref 148–442)

## 2022-01-24 LAB — ERYTHROPOIETIN: Erythropoietin: 26.6 m[IU]/mL — ABNORMAL HIGH (ref 2.6–18.5)

## 2022-01-25 LAB — HGB FRACTIONATION CASCADE
Hgb A2: 2 % (ref 1.8–3.2)
Hgb A: 98 % (ref 96.4–98.8)
Hgb F: 0 % (ref 0.0–2.0)
Hgb S: 0 %

## 2022-01-30 ENCOUNTER — Inpatient Hospital Stay: Payer: BC Managed Care – PPO

## 2022-01-30 NOTE — Progress Notes (Signed)
Patient in for iron infusion today, states she nauseated, has a sore throat and has been coughing a little since last Thursday. States her granddaughter had RSV last week. Pt provided with a  mask and informed we will have to reschedule her iron infusion for when she is feeling better. Pt understands scheduling will call her to reschedule for 1 week and if she is not feeling better before that appt to call us and let us know.

## 2022-02-01 DIAGNOSIS — R110 Nausea: Secondary | ICD-10-CM | POA: Diagnosis not present

## 2022-02-01 DIAGNOSIS — J069 Acute upper respiratory infection, unspecified: Secondary | ICD-10-CM | POA: Diagnosis not present

## 2022-02-01 DIAGNOSIS — R079 Chest pain, unspecified: Secondary | ICD-10-CM | POA: Diagnosis not present

## 2022-02-01 DIAGNOSIS — R059 Cough, unspecified: Secondary | ICD-10-CM | POA: Diagnosis not present

## 2022-02-06 ENCOUNTER — Inpatient Hospital Stay: Payer: BC Managed Care – PPO

## 2022-02-06 VITALS — BP 97/29 | HR 71 | Temp 97.9°F | Resp 18

## 2022-02-06 DIAGNOSIS — D509 Iron deficiency anemia, unspecified: Secondary | ICD-10-CM | POA: Diagnosis not present

## 2022-02-06 DIAGNOSIS — E611 Iron deficiency: Secondary | ICD-10-CM

## 2022-02-06 MED ORDER — SODIUM CHLORIDE 0.9 % IV SOLN: INTRAVENOUS | Status: DC

## 2022-02-06 MED ORDER — SODIUM CHLORIDE 0.9 % IV SOLN: Freq: Once | INTRAVENOUS | Status: AC

## 2022-02-06 MED ORDER — SODIUM CHLORIDE 0.9 % IV SOLN
300.0000 mg | Freq: Once | INTRAVENOUS | Status: AC
  Administered 2022-02-06: 300 mg via INTRAVENOUS
  Filled 2022-02-06: qty 300

## 2022-02-06 NOTE — Patient Instructions (Addendum)
Iron Sucrose Injection What is this medication? IRON SUCROSE (EYE ern SOO krose) treats low levels of iron (iron deficiency anemia) in people with kidney disease. Iron is a mineral that plays an important role in making red blood cells, which carry oxygen from your lungs to the rest of your body. This medicine may be used for other purposes; ask your health care provider or pharmacist if you have questions. COMMON BRAND NAME(S): Venofer What should I tell my care team before I take this medication? They need to know if you have any of these conditions: Anemia not caused by low iron levels Heart disease High levels of iron in the blood Kidney disease Liver disease An unusual or allergic reaction to iron, other medications, foods, dyes, or preservatives Pregnant or trying to get pregnant Breastfeeding How should I use this medication? This medication is for infusion into a vein. It is given in a hospital or clinic setting. Talk to your care team about the use of this medication in children. While this medication may be prescribed for children as young as 2 years for selected conditions, precautions do apply. Overdosage: If you think you have taken too much of this medicine contact a poison control center or emergency room at once. NOTE: This medicine is only for you. Do not share this medicine with others. What if I miss a dose? Keep appointments for follow-up doses. It is important not to miss your dose. Call your care team if you are unable to keep an appointment. What may interact with this medication? Do not take this medication with any of the following: Deferoxamine Dimercaprol Other iron products This medication may also interact with the following: Chloramphenicol Deferasirox This list may not describe all possible interactions. Give your health care provider a list of all the medicines, herbs, non-prescription drugs, or dietary supplements you use. Also tell them if you smoke,  drink alcohol, or use illegal drugs. Some items may interact with your medicine. What should I watch for while using this medication? Visit your care team regularly. Tell your care team if your symptoms do not start to get better or if they get worse. You may need blood work done while you are taking this medication. You may need to follow a special diet. Talk to your care team. Foods that contain iron include: whole grains/cereals, dried fruits, beans, or peas, leafy green vegetables, and organ meats (liver, kidney). What side effects may I notice from receiving this medication? Side effects that you should report to your care team as soon as possible: Allergic reactions--skin rash, itching, hives, swelling of the face, lips, tongue, or throat Low blood pressure--dizziness, feeling faint or lightheaded, blurry vision Shortness of breath Side effects that usually do not require medical attention (report to your care team if they continue or are bothersome): Flushing Headache Joint pain Muscle pain Nausea Pain, redness, or irritation at injection site This list may not describe all possible side effects. Call your doctor for medical advice about side effects. You may report side effects to FDA at 1-800-FDA-1088. Where should I keep my medication? This medication is given in a hospital or clinic and will not be stored at home. NOTE: This sheet is a summary. It may not cover all possible information. If you have questions about this medicine, talk to your doctor, pharmacist, or health care provider.  2023 Elsevier/Gold Standard (2020-06-10 00:00:00) Dehydration, Adult Dehydration is a condition in which there is not enough water or other fluids in the  body. This happens when a person loses more fluids than he or she takes in. Important organs, such as the kidneys, brain, and heart, cannot function without a proper amount of fluids. Any loss of fluids from the body can lead to  dehydration. Dehydration can be mild, moderate, or severe. It should be treated right away to prevent it from becoming severe. What are the causes? Dehydration may be caused by: Conditions that cause loss of water or other fluids, such as diarrhea, vomiting, or sweating or urinating a lot. Not drinking enough fluids, especially when you are ill or doing activities that require a lot of energy. Other illnesses and conditions, such as fever or infection. Certain medicines, such as medicines that remove excess fluid from the body (diuretics). Lack of safe drinking water. Not being able to get enough water and food. What increases the risk? The following factors may make you more likely to develop this condition: Having a long-term (chronic) illness that has not been treated properly, such as diabetes, heart disease, or kidney disease. Being 49 years of age or older. Having a disability. Living in a place that is high in altitude, where thinner, drier air causes more fluid loss. Doing exercises that put stress on your body for a long time (endurance sports). What are the signs or symptoms? Symptoms of dehydration depend on how severe it is. Mild or moderate dehydration Thirst. Dry lips or dry mouth. Dizziness or light-headedness, especially when standing up from a seated position. Muscle cramps. Dark urine. Urine may be the color of tea. Less urine or tears produced than usual. Headache. Severe dehydration Changes in skin. Your skin may be cold and clammy, blotchy, or pale. Your skin also may not return to normal after being lightly pinched and released. Little or no tears, urine, or sweat. Changes in vital signs, such as rapid breathing and low blood pressure. Your pulse may be weak or may be faster than 100 beats a minute when you are sitting still. Other changes, such as: Feeling very thirsty. Sunken eyes. Cold hands and feet. Confusion. Being very tired (lethargic) or having  trouble waking from sleep. Short-term weight loss. Loss of consciousness. How is this diagnosed? This condition is diagnosed based on your symptoms and a physical exam. You may have blood and urine tests to help confirm the diagnosis. How is this treated? Treatment for this condition depends on how severe it is. Treatment should be started right away. Do not wait until dehydration becomes severe. Severe dehydration is an emergency and needs to be treated in a hospital. Mild or moderate dehydration can be treated at home. You may be asked to: Drink more fluids. Drink an oral rehydration solution (ORS). This drink helps restore proper amounts of fluids and salts and minerals in the blood (electrolytes). Severe dehydration can be treated: With IV fluids. By correcting abnormal levels of electrolytes. This is often done by giving electrolytes through a tube that is passed through your nose and into your stomach (nasogastric tube, or NG tube). By treating the underlying cause of dehydration. Follow these instructions at home: Oral rehydration solution If told by your health care provider, drink an ORS: Make an ORS by following instructions on the package. Start by drinking small amounts, about  cup (120 mL) every 5-10 minutes. Slowly increase how much you drink until you have taken the amount recommended by your health care provider. Eating and drinking        Drink enough clear fluid to  keep your urine pale yellow. If you were told to drink an ORS, finish the ORS first and then start slowly drinking other clear fluids. Drink fluids such as: Water. Do not drink only water. Doing that can lead to hyponatremia, which is having too little salt (sodium) in the body. Water from ice chips you suck on. Fruit juice that you have added water to (diluted fruit juice). Low-calorie sports drinks. Eat foods that contain a healthy balance of electrolytes, such as bananas, oranges, potatoes, tomatoes,  and spinach. Do not drink alcohol. Avoid the following: Drinks that contain a lot of sugar. These include high-calorie sports drinks, fruit juice that is not diluted, and soda. Caffeine. Foods that are greasy or contain a lot of fat or sugar. General instructions Take over-the-counter and prescription medicines only as told by your health care provider. Do not take sodium tablets. Doing that can lead to having too much sodium in the body (hypernatremia). Return to your normal activities as told by your health care provider. Ask your health care provider what activities are safe for you. Keep all follow-up visits as told by your health care provider. This is important. Contact a health care provider if: You have muscle cramps, pain, or discomfort, such as: Pain in your abdomen and the pain gets worse or stays in one area (localizes). Stiff neck. You have a rash. You are more irritable than usual. You are sleepier or have a harder time waking than usual. You feel weak or dizzy. You feel very thirsty. Get help right away if you have: Any symptoms of severe dehydration. Symptoms of vomiting, such as: You cannot eat or drink without vomiting. Vomiting gets worse or does not go away. Vomit includes blood or green matter (bile). Symptoms that get worse with treatment. A fever. A severe headache. Problems with urination or bowel movements, such as: Diarrhea that gets worse or does not go away. Blood in your stool (feces). This may cause stool to look black and tarry. Not urinating, or urinating only a small amount of very dark urine, within 6-8 hours. Trouble breathing. These symptoms may represent a serious problem that is an emergency. Do not wait to see if the symptoms will go away. Get medical help right away. Call your local emergency services (911 in the [place] Do not drive yourself to the hospital. Summary Dehydration is a condition in which there is not enough water or other  fluids in the body. This happens when a person loses more fluids than he or she takes in. Treatment for this condition depends on how severe it is. Treatment should be started right away. Do not wait until dehydration becomes severe. Drink enough clear fluid to keep your urine pale yellow. If you were told to drink an oral rehydration solution (ORS), finish the ORS first and then start slowly drinking other clear fluids. Take over-the-counter and prescription medicines only as told by your health care provider. Get help right away if you have any symptoms of severe dehydration. This information is not intended to replace advice given to you by your health care provider. Make sure you discuss any questions you have with your health care provider. Document Revised: 07/06/2021 Document Reviewed: 10/10/2018 Elsevier Patient Education  [place].

## 2022-02-06 NOTE — Progress Notes (Signed)
Pt was 30 minutes post fluids post iron infusion and stated she started feeling numbness in her fingertips. Pt got up to go to the bathroom and called for help. Upon arrival pt had a loose BM and was stating she was hot and felt nausea. Blood pressure was taken and it was 78/69. Pt complained of slight SOB - oxygen was given on 2L. Pt did not vomit. Pt declined any other s/s. Pt stated she was feeling slightly better. Moved pt back to infusion room to get IV fluids.

## 2022-02-08 LAB — ALPHA-THALASSEMIA GENOTYPR

## 2022-02-13 ENCOUNTER — Inpatient Hospital Stay: Payer: BC Managed Care – PPO | Attending: Hematology & Oncology

## 2022-02-13 VITALS — BP 115/58 | HR 73 | Temp 97.8°F | Resp 18

## 2022-02-13 DIAGNOSIS — D509 Iron deficiency anemia, unspecified: Secondary | ICD-10-CM | POA: Diagnosis not present

## 2022-02-13 DIAGNOSIS — E611 Iron deficiency: Secondary | ICD-10-CM

## 2022-02-13 MED ORDER — SODIUM CHLORIDE 0.9 % IV SOLN: Freq: Once | INTRAVENOUS | Status: AC

## 2022-02-13 MED ORDER — SODIUM CHLORIDE 0.9 % IV SOLN
300.0000 mg | Freq: Once | INTRAVENOUS | Status: AC
  Administered 2022-02-13: 300 mg via INTRAVENOUS
  Filled 2022-02-13: qty 300

## 2022-02-13 NOTE — Patient Instructions (Signed)

## 2022-02-20 ENCOUNTER — Inpatient Hospital Stay: Payer: BC Managed Care – PPO

## 2022-02-20 ENCOUNTER — Ambulatory Visit: Payer: BC Managed Care – PPO | Admitting: Obstetrics and Gynecology

## 2022-02-20 VITALS — BP 98/60 | HR 62 | Temp 98.1°F | Resp 18

## 2022-02-20 DIAGNOSIS — D509 Iron deficiency anemia, unspecified: Secondary | ICD-10-CM | POA: Diagnosis not present

## 2022-02-20 DIAGNOSIS — E611 Iron deficiency: Secondary | ICD-10-CM

## 2022-02-20 MED ORDER — SODIUM CHLORIDE 0.9 % IV SOLN
300.0000 mg | Freq: Once | INTRAVENOUS | Status: AC
  Administered 2022-02-20: 300 mg via INTRAVENOUS
  Filled 2022-02-20: qty 300

## 2022-02-20 MED ORDER — SODIUM CHLORIDE 0.9 % IV SOLN: Freq: Once | INTRAVENOUS | Status: AC

## 2022-02-20 NOTE — Patient Instructions (Signed)

## 2022-03-10 ENCOUNTER — Encounter: Payer: Self-pay | Admitting: Family Medicine

## 2022-03-10 ENCOUNTER — Telehealth: Payer: BC Managed Care – PPO | Admitting: Family Medicine

## 2022-03-10 DIAGNOSIS — U071 COVID-19: Secondary | ICD-10-CM

## 2022-03-10 MED ORDER — PROMETHAZINE-DM 6.25-15 MG/5ML PO SYRP: 5.0000 mL | ORAL_SOLUTION | Freq: Three times a day (TID) | ORAL | 0 refills | Status: DC | PRN

## 2022-03-10 MED ORDER — NIRMATRELVIR/RITONAVIR (PAXLOVID)TABLET: 3.0000 | ORAL_TABLET | Freq: Two times a day (BID) | ORAL | 0 refills | Status: AC

## 2022-03-10 NOTE — Progress Notes (Signed)
[place]   Needs to be seen by Video or in person

## 2022-03-10 NOTE — Progress Notes (Signed)
Virtual Visit Consent   Kathy Craig, you are scheduled for a virtual visit with a [facility] provider today. Just as with appointments in the office, your consent must be obtained to participate. Your consent will be active for this visit and any virtual visit you may have with one of our providers in the next 365 days. If you have a MyChart account, a copy of this consent can be sent to you electronically.  As this is a virtual visit, video technology does not allow for your provider to perform a traditional examination. This may limit your provider's ability to fully assess your condition. If your provider identifies any concerns that need to be evaluated in person or the need to arrange testing (such as labs, EKG, etc.), we will make arrangements to do so. Although advances in technology are sophisticated, we cannot ensure that it will always work on either your end or our end. If the connection with a video visit is poor, the visit may have to be switched to a telephone visit. With either a video or telephone visit, we are not always able to ensure that we have a secure connection.  By engaging in this virtual visit, you consent to the provision of healthcare and authorize for your insurance to be billed (if applicable) for the services provided during this visit. Depending on your insurance coverage, you may receive a charge related to this service.  I need to obtain your verbal consent now. Are you willing to proceed with your visit today? Kathy Craig has provided verbal consent on 03/10/2022 for a virtual visit (video or telephone). Perlie Mayo, NP  Date: 03/10/2022 2:23 PM  Virtual Visit via Video Note   I, Perlie Mayo, connected with  Kathy Craig  ([id number], 1970/05/23) on 03/10/22 at  2:30 PM EST by a video-enabled telemedicine application and verified that I am speaking with the correct person using two identifiers.  Location: Patient: Virtual Visit Location Patient:  Home Provider: Virtual Visit Location Provider: Home Office   I discussed the limitations of evaluation and management by telemedicine and the availability of in person appointments. The patient expressed understanding and agreed to proceed.    History of Present Illness: Kathy Craig is a 51 y.o. who identifies as a female who was assigned female at birth, and is being seen today for COVID + Wednesday- didn't really taste food for dinner. Thursday had a headache, sore throat and runny nose. Yesterday tested positive for COVID afternoon. Loss of smell and taste. Cough- keeping up at night, Feverish/chill- body aches. Denies chest pain, shortness of breath.   Problems:  Patient Active Problem List   Diagnosis Date Noted   Fibroids 07/07/2018   Iron deficiency 02/14/2018    Allergies: No Known Allergies Medications:  Current Outpatient Medications:    Melatonin 10 MG CAPS, Take tablet (10 mg total) by mouth at bedtime as needed., Disp: 90 capsule, Rfl: 2  Observations/Objective: Patient is well-developed, well-nourished in no acute distress.  Resting comfortably  at home.  Head is normocephalic, atraumatic.  No labored breathing.  Speech is clear and coherent with logical content.  Patient is alert and oriented at baseline.    Assessment and Plan: 1. COVID-19  - nirmatrelvir/ritonavir (PAXLOVID) 20 x 150 MG & 10 x '100MG'$  TABS; Take 3 tablets by mouth 2 (two) times daily for 5 days. (Take nirmatrelvir 150 mg two tablets twice daily for 5 days and ritonavir 100 mg one tablet twice daily for 5  days) Patient GFR is 99  Dispense: 30 tablet; Refill: 0 - promethazine-dextromethorphan (PROMETHAZINE-DM) 6.25-15 MG/5ML syrup; Take 5 mLs by mouth 3 (three) times daily as needed for cough.  Dispense: 118 mL; Refill: 0  - Continue OTC symptomatic management of choice - Will send OTC vitamins and supplement information through AVS - Take above meds as ordered prescribed - Patient enrolled in  MyChart symptom monitoring - Push fluids - Rest as needed - Discussed return precautions and when to seek in-person evaluation, sent via AVS as well      Follow Up Instructions: I discussed the assessment and treatment plan with the patient. The patient was provided an opportunity to ask questions and all were answered. The patient agreed with the plan and demonstrated an understanding of the instructions.  A copy of instructions were sent to the patient via MyChart unless otherwise noted below.     The patient was advised to call back or seek an in-person evaluation if the symptoms worsen or if the condition fails to improve as anticipated.  Time:  I spent 10 minutes with the patient via telehealth technology discussing the above problems/concerns.    Perlie Mayo, NP

## 2022-03-10 NOTE — Patient Instructions (Addendum)
Kathy Craig, thank you for joining Perlie Mayo, NP for today's virtual visit.  While this provider is not your primary care provider (PCP), if your PCP is located in our provider database this encounter information will be shared with them immediately following your visit.   [place] account gives you access to today's visit and all your visits, tests, and labs performed at [facility] " click here if you don't have a [facility] account or go to [email or web address]  Consent: (Patient) Kathy Craig provided verbal consent for this virtual visit at the beginning of the encounter.  Current Medications:  Current Outpatient Medications:    nirmatrelvir/ritonavir (PAXLOVID) 20 x 150 MG & 10 x '100MG'$  TABS, Take 3 tablets by mouth 2 (two) times daily for 5 days. (Take nirmatrelvir 150 mg two tablets twice daily for 5 days and ritonavir 100 mg one tablet twice daily for 5 days) Patient GFR is 99, Disp: 30 tablet, Rfl: 0   promethazine-dextromethorphan (PROMETHAZINE-DM) 6.25-15 MG/5ML syrup, Take 5 mLs by mouth 3 (three) times daily as needed for cough., Disp: 118 mL, Rfl: 0   Melatonin 10 MG CAPS, Take tablet (10 mg total) by mouth at bedtime as needed., Disp: 90 capsule, Rfl: 2   Medications ordered in this encounter:  Meds ordered this encounter  Medications   nirmatrelvir/ritonavir (PAXLOVID) 20 x 150 MG & 10 x '100MG'$  TABS    Sig: Take 3 tablets by mouth 2 (two) times daily for 5 days. (Take nirmatrelvir 150 mg two tablets twice daily for 5 days and ritonavir 100 mg one tablet twice daily for 5 days) Patient GFR is 99    Dispense:  30 tablet    Refill:  0    Order Specific Question:   Supervising Provider    Answer:   Chase Picket [[id number]]   promethazine-dextromethorphan (PROMETHAZINE-DM) 6.25-15 MG/5ML syrup    Sig: Take 5 mLs by mouth 3 (three) times daily as needed for cough.    Dispense:  118 mL    Refill:  0    Order Specific Question:    Supervising Provider    Answer:   Chase Picket [[id number]]     *If you need refills on other medications prior to your next appointment, please contact your pharmacy*  Follow-Up: Call back or seek an in-person evaluation if the symptoms worsen or if the condition fails to improve as anticipated.  [place] [phone]  Other Instructions  - Continue OTC symptomatic management of choice - Will send OTC vitamins and supplement information through AVS - Take above meds as ordered prescribed - Patient enrolled in MyChart symptom monitoring - Push fluids - Rest as needed  Please keep well-hydrated and get plenty of rest. Start a saline nasal rinse to flush out your nasal passages. You can use plain Mucinex to help thin congestion. If you have a humidifier, running in the bedroom at night. I want you to start OTC vitamin D3 1000 units daily, vitamin C 1000 mg daily, and a zinc supplement. Please take prescribed medications as directed.  You have been enrolled in a MyChart symptom monitoring program. Please answer these questions daily so we can keep track of how you are doing.  You were to quarantine for 5 days from onset of your symptoms.  After day 5, if you have had no fever and you are feeling better, you can end quarantine but need to mask for an additional 5 days. After  day 5 if you have a fever or are having significant symptoms, please quarantine for full 10 days.  If you note any worsening of symptoms, any significant shortness of breath or any chest pain, please seek ER evaluation ASAP.  Please do not delay care!  COVID-19: What to Do if You Are Sick If you test positive and are an older adult or someone who is at high risk of getting very sick from COVID-19, treatment may be available. Contact a healthcare provider right away after a positive test to determine if you are eligible, even if your symptoms are mild right now. You can also visit a Test to  Treat location and, if eligible, receive a prescription from a provider. Don't delay: Treatment must be started within the first few days to be effective. If you have a fever, cough, or other symptoms, you might have COVID-19. Most people have mild illness and are able to recover at home. If you are sick: Keep track of your symptoms. If you have an emergency warning sign (including trouble breathing), call 911. Steps to help prevent the spread of COVID-19 if you are sick If you are sick with COVID-19 or think you might have COVID-19, follow the steps below to care for yourself and to help protect other people in your home and community. Stay home except to get medical care Stay home. Most people with COVID-19 have mild illness and can recover at home without medical care. Do not leave your home, except to get medical care. Do not visit public areas and do not go to places where you are unable to wear a mask. Take care of yourself. Get rest and stay hydrated. Take over-the-counter medicines, such as acetaminophen, to help you feel better. Stay in touch with your doctor. Call before you get medical care. Be sure to get care if you have trouble breathing, or have any other emergency warning signs, or if you think it is an emergency. Avoid public transportation, ride-sharing, or taxis if possible. Get tested If you have symptoms of COVID-19, get tested. While waiting for test results, stay away from others, including staying apart from those living in your household. Get tested as soon as possible after your symptoms start. Treatments may be available for people with COVID-19 who are at risk for becoming very sick. Don't delay: Treatment must be started early to be effective--some treatments must begin within 5 days of your first symptoms. Contact your healthcare provider right away if your test result is positive to determine if you are eligible. Self-tests are one of several options for testing for the  virus that causes COVID-19 and may be more convenient than laboratory-based tests and point-of-care tests. Ask your healthcare provider or your local health department if you need help interpreting your test results. You can visit your state, tribal, local, and territorial health department's website to look for the latest local information on testing sites. Separate yourself from other people As much as possible, stay in a specific room and away from other people and pets in your home. If possible, you should use a separate bathroom. If you need to be around other people or animals in or outside of the home, wear a well-fitting mask. Tell your close contacts that they may have been exposed to COVID-19. An infected person can spread COVID-19 starting 48 hours (or 2 days) before the person has any symptoms or tests positive. By letting your close contacts know they may have been exposed to COVID-19,  you are helping to protect everyone. See COVID-19 and Animals if you have questions about pets. If you are diagnosed with COVID-19, someone from the health department may call you. Answer the call to slow the spread. Monitor your symptoms Symptoms of COVID-19 include fever, cough, or other symptoms. Follow care instructions from your healthcare provider and local health department. Your local health authorities may give instructions on checking your symptoms and reporting information. When to seek emergency medical attention Look for emergency warning signs* for COVID-19. If someone is showing any of these signs, seek emergency medical care immediately: Trouble breathing Persistent pain or pressure in the chest New confusion Inability to wake or stay awake Pale, gray, or blue-colored skin, lips, or nail beds, depending on skin tone *This list is not all possible symptoms. Please call your medical provider for any other symptoms that are severe or concerning to you. Call 911 or call ahead to your local  emergency facility: Notify the operator that you are seeking care for someone who has or may have COVID-19. Call ahead before visiting your doctor Call ahead. Many medical visits for routine care are being postponed or done by phone or telemedicine. If you have a medical appointment that cannot be postponed, call your doctor's office, and tell them you have or may have COVID-19. This will help the office protect themselves and other patients. If you are sick, wear a well-fitting mask You should wear a mask if you must be around other people or animals, including pets (even at home). Wear a mask with the best fit, protection, and comfort for you. You don't need to wear the mask if you are alone. If you can't put on a mask (because of trouble breathing, for example), cover your coughs and sneezes in some other way. Try to stay at least 6 feet away from other people. This will help protect the people around you. Masks should not be placed on young children under age 15 years, anyone who has trouble breathing, or anyone who is not able to remove the mask without help. Cover your coughs and sneezes Cover your mouth and nose with a tissue when you cough or sneeze. Throw away used tissues in a lined trash can. Immediately wash your hands with soap and water for at least 20 seconds. If soap and water are not available, clean your hands with an alcohol-based hand sanitizer that contains at least 60% alcohol. Clean your hands often Wash your hands often with soap and water for at least 20 seconds. This is especially important after blowing your nose, coughing, or sneezing; going to the bathroom; and before eating or preparing food. Use hand sanitizer if soap and water are not available. Use an alcohol-based hand sanitizer with at least 60% alcohol, covering all surfaces of your hands and rubbing them together until they feel dry. Soap and water are the best option, especially if hands are visibly dirty. Avoid  touching your eyes, nose, and mouth with unwashed hands. Handwashing Tips Avoid sharing personal household items Do not share dishes, drinking glasses, cups, eating utensils, towels, or bedding with other people in your home. Wash these items thoroughly after using them with soap and water or put in the dishwasher. Clean surfaces in your home regularly Clean and disinfect high-touch surfaces (for example, doorknobs, tables, handles, light switches, and countertops) in your "sick room" and bathroom. In shared spaces, you should clean and disinfect surfaces and items after each use by the person who is ill.  If you are sick and cannot clean, a caregiver or other person should only clean and disinfect the area around you (such as your bedroom and bathroom) on an as needed basis. Your caregiver/other person should wait as long as possible (at least several hours) and wear a mask before entering, cleaning, and disinfecting shared spaces that you use. Clean and disinfect areas that may have blood, stool, or body fluids on them. Use household cleaners and disinfectants. Clean visible dirty surfaces with household cleaners containing soap or detergent. Then, use a household disinfectant. Use a product from [employer] List N: Disinfectants for Coronavirus (YDXAJ-28). Be sure to follow the instructions on the label to ensure safe and effective use of the product. Many products recommend keeping the surface wet with a disinfectant for a certain period of time (look at "contact time" on the product label). You may also need to wear personal protective equipment, such as gloves, depending on the directions on the product label. Immediately after disinfecting, wash your hands with soap and water for 20 seconds. For completed guidance on cleaning and disinfecting your home, visit Complete Disinfection Guidance. Take steps to improve ventilation at home Improve ventilation (air flow) at home to help prevent from spreading  COVID-19 to other people in your household. Clear out COVID-19 virus particles in the air by opening windows, using air filters, and turning on fans in your home. Use this interactive tool to learn how to improve air flow in your home. When you can be around others after being sick with COVID-19 Deciding when you can be around others is different for different situations. Find out when you can safely end home isolation. For any additional questions about your care, contact your healthcare provider or state or local health department. 06/01/2020 Content source: [facility] ([facility]), Division of Viral Diseases This information is not intended to replace advice given to you by your health care provider. Make sure you discuss any questions you have with your health care provider. Document Revised: 07/15/2020 Document Reviewed: 07/15/2020 Elsevier Patient Education  2022 [employer].      If you have been instructed to have an in-person evaluation today at a local Urgent Care facility, please use the link below. It will take you to a list of all of our available [facility], including address, phone number and hours of operation. Please do not delay care.  [facility]  If you or a family member do not have a primary care provider, use the link below to schedule a visit and establish care. When you choose a [facility] primary care physician or advanced practice provider, you gain a long-term partner in health. Find a Primary Care Provider  Learn more about 's in-office and virtual care options: [facility]

## 2022-03-27 ENCOUNTER — Inpatient Hospital Stay: Payer: BC Managed Care – PPO | Attending: Hematology & Oncology

## 2022-03-27 ENCOUNTER — Encounter: Payer: Self-pay | Admitting: Family

## 2022-03-27 ENCOUNTER — Inpatient Hospital Stay (HOSPITAL_BASED_OUTPATIENT_CLINIC_OR_DEPARTMENT_OTHER): Payer: BC Managed Care – PPO | Admitting: Family

## 2022-03-27 ENCOUNTER — Other Ambulatory Visit: Payer: Self-pay

## 2022-03-27 VITALS — BP 113/62 | HR 78 | Temp 98.1°F | Resp 17

## 2022-03-27 DIAGNOSIS — D500 Iron deficiency anemia secondary to blood loss (chronic): Secondary | ICD-10-CM

## 2022-03-27 DIAGNOSIS — D509 Iron deficiency anemia, unspecified: Secondary | ICD-10-CM | POA: Insufficient documentation

## 2022-03-27 LAB — CBC WITH DIFFERENTIAL (CANCER CENTER ONLY)
Abs Immature Granulocytes: 0.01 10*3/uL (ref 0.00–0.07)
Basophils Absolute: 0 10*3/uL (ref 0.0–0.1)
Basophils Relative: 1 %
Eosinophils Absolute: 0.1 10*3/uL (ref 0.0–0.5)
Eosinophils Relative: 3 %
HCT: 36.4 % (ref 36.0–46.0)
Hemoglobin: 11.8 g/dL — ABNORMAL LOW (ref 12.0–15.0)
Immature Granulocytes: 0 %
Lymphocytes Relative: 31 %
Lymphs Abs: 1.4 10*3/uL (ref 0.7–4.0)
MCH: 31.1 pg (ref 26.0–34.0)
MCHC: 32.4 g/dL (ref 30.0–36.0)
MCV: 95.8 fL (ref 80.0–100.0)
Monocytes Absolute: 0.4 10*3/uL (ref 0.1–1.0)
Monocytes Relative: 8 %
Neutro Abs: 2.7 10*3/uL (ref 1.7–7.7)
Neutrophils Relative %: 57 %
Platelet Count: 301 10*3/uL (ref 150–400)
RBC: 3.8 MIL/uL — ABNORMAL LOW (ref 3.87–5.11)
RDW: 14.6 % (ref 11.5–15.5)
WBC Count: 4.7 10*3/uL (ref 4.0–10.5)
nRBC: 0 % (ref 0.0–0.2)

## 2022-03-27 LAB — RETICULOCYTES
Immature Retic Fract: 10.8 % (ref 2.3–15.9)
RBC.: 3.67 MIL/uL — ABNORMAL LOW (ref 3.87–5.11)
Retic Count, Absolute: 85.1 10*3/uL (ref 19.0–186.0)
Retic Ct Pct: 2.3 % (ref 0.4–3.1)

## 2022-03-27 LAB — FERRITIN: Ferritin: 167 ng/mL (ref 11–307)

## 2022-03-27 NOTE — Progress Notes (Signed)
Hematology and Oncology Follow Up Visit  Kathy Craig [id number] 10-13-1970 52 y.o. 03/27/2022   Principle Diagnosis:  Iron deficiency anemia    Current Therapy:   IV iron as indicated    Interim History:  Kathy Craig is here today for follow-up. She is doing well and notes an improvement in her energy since receiving IV iron.  She had some dizziness upon standing and tingling in the fingers and toes with her first infusion but no other issues after that.  No falls or syncope.  Her cycle is regular with heavy flow unchanged from baseline. No other blood loss noted.  No bruising or petechiae.  She is having trouble sleeping at night despite taking Melatonin and plans to follow-up with her PCP.  No fever, chills, n/v, cough, rash, dizziness, SOB, chest pain, palpitations, abdominal pain or changes in bowel or bladder habits at this time.  No swelling, tenderness, numbness or tingling in her extremities.  Appetite and hydration are good. Weight is stable at 162 lbs.   ECOG Performance Status: 1 - Symptomatic but completely ambulatory  Medications:  Allergies as of 03/27/2022   No Known Allergies      Medication List        Accurate as of March 27, 2022  2:19 PM. If you have any questions, ask your nurse or doctor.          Melatonin 10 MG Caps Take tablet (10 mg total) by mouth at bedtime as needed.   promethazine-dextromethorphan 6.25-15 MG/5ML syrup Commonly known as: PROMETHAZINE-DM Take 5 mLs by mouth 3 (three) times daily as needed for cough.        Allergies: No Known Allergies  Past Medical History, Surgical history, Social history, and Family History were reviewed and updated.  Review of Systems: All other 10 point review of systems is negative.   Physical Exam:  vitals were not taken for this visit.   Wt Readings from Last 3 Encounters:  01/23/22 161 lb 1.9 oz (73.1 kg)  12/09/21 166 lb 3.2 oz (75.4 kg)  11/29/21 167 lb (75.8 kg)    Ocular:  Sclerae unicteric, pupils equal, round and reactive to light Ear-nose-throat: Oropharynx clear, dentition fair Lymphatic: No cervical or supraclavicular adenopathy Lungs no rales or rhonchi, good excursion bilaterally Heart regular rate and rhythm, no murmur appreciated Abd soft, nontender, positive bowel sounds MSK no focal spinal tenderness, no joint edema Neuro: non-focal, well-oriented, appropriate affect Breasts: Deferred  Lab Results  Component Value Date   WBC 4.7 01/23/2022   HGB 11.5 (L) 01/23/2022   HCT 36.4 01/23/2022   MCV 95.0 01/23/2022   PLT 284 01/23/2022   Lab Results  Component Value Date   FERRITIN 8 (L) 01/23/2022   IRON 49 01/23/2022   TIBC 414 01/23/2022   UIBC 365 01/23/2022   IRONPCTSAT 12 01/23/2022   Lab Results  Component Value Date   RETICCTPCT 1.9 01/23/2022   RBC 3.79 (L) 01/23/2022   No results found for: "KPAFRELGTCHN", "LAMBDASER", "KAPLAMBRATIO" No results found for: "IGGSERUM", "IGA", "IGMSERUM" No results found for: "TOTALPROTELP", "ALBUMINELP", "A1GS", "A2GS", "BETS", "BETA2SER", "GAMS", "MSPIKE", "SPEI"   Chemistry      Component Value Date/Time   NA 140 01/23/2022 1443   K 4.1 01/23/2022 1443   CL 105 01/23/2022 1443   CO2 28 01/23/2022 1443   BUN 13 01/23/2022 1443   CREATININE 0.75 01/23/2022 1443   CREATININE 0.58 06/23/2016 1551      Component Value Date/Time   CALCIUM  9.5 01/23/2022 1443   ALKPHOS 62 01/23/2022 1443   AST 17 01/23/2022 1443   ALT 10 01/23/2022 1443   BILITOT 0.9 01/23/2022 1443       Impression and Plan: Kathy Craig is a very pleasant 52 yo African American female with history of iron deficiency anemia secondary to heavy cycles.  Iron studies pending.  Follow-up in 3 months.   Lottie Dawson, NP 1/15/20242:19 PM

## 2022-03-28 LAB — IRON AND IRON BINDING CAPACITY (CC-WL,HP ONLY)
Iron: 116 ug/dL (ref 28–170)
Saturation Ratios: 44 % — ABNORMAL HIGH (ref 10.4–31.8)
TIBC: 266 ug/dL (ref 250–450)
UIBC: 150 ug/dL (ref 148–442)

## 2022-06-23 ENCOUNTER — Encounter: Payer: Self-pay | Admitting: Family

## 2022-06-23 ENCOUNTER — Ambulatory Visit (INDEPENDENT_AMBULATORY_CARE_PROVIDER_SITE_OTHER): Payer: BC Managed Care – PPO | Admitting: Family

## 2022-06-23 VITALS — BP 112/72 | HR 85 | Resp 18 | Ht 65.0 in | Wt 160.0 lb

## 2022-06-23 DIAGNOSIS — G4700 Insomnia, unspecified: Secondary | ICD-10-CM | POA: Diagnosis not present

## 2022-06-23 DIAGNOSIS — F419 Anxiety disorder, unspecified: Secondary | ICD-10-CM | POA: Diagnosis not present

## 2022-06-23 MED ORDER — ESCITALOPRAM OXALATE 10 MG PO TABS: 10.0000 mg | ORAL_TABLET | Freq: Every day | ORAL | 0 refills | Status: DC

## 2022-06-23 MED ORDER — ZOLPIDEM TARTRATE 5 MG PO TABS: 5.0000 mg | ORAL_TABLET | Freq: Every evening | ORAL | 1 refills | Status: DC | PRN

## 2022-06-23 NOTE — Progress Notes (Signed)
  Kathy Craig is a 52 y.o. female with the following history as recorded in EpicCare:  Patient Active Problem List   Diagnosis Date Noted   Fibroids 07/07/2018   Iron deficiency 02/14/2018    Current Outpatient Medications  Medication Sig Dispense Refill   escitalopram (LEXAPRO) 10 MG tablet Take 1 tablet (10 mg total) by mouth daily. 90 tablet 0   zolpidem (AMBIEN) 5 MG tablet Take 1 tablet (5 mg total) by mouth at bedtime as needed for sleep. 30 tablet 1   No current facility-administered medications for this visit.    Allergies: Patient has no known allergies.  Past Medical History:  Diagnosis Date   Anemia 11/26/2019   10.1 - [facility]   Elevated hemoglobin A1c 11/26/2019   5.9 - [facility]    Fibroid    5.5 cm possible partially submucous fibroid   Hydrosalpinx 10/15/14   right   Low vitamin D level 11/26/2019   16.04 - [facility]    Past Surgical History:  Procedure Laterality Date   mirena     removed 2013    Family History  Problem Relation Age of Onset   Heart disease Mother        CHF   Hypertension Mother    Diabetes Father    Sarcoidosis Sister 26       deceased as complication of   Heart attack Maternal Uncle    Breast cancer Maternal Grandmother 32        ? death from breast cancer   Hypertension Maternal Grandmother     Social History   Tobacco Use   Smoking status: Never   Smokeless tobacco: Never  Substance Use Topics   Alcohol use: Yes    Comment: socially    Subjective:   Persisting/ worsening problems with anxiety/ insomnia; would like to discuss trial of medication; not sleeping well at night/ "mind is racing";  Periods do seem to be improving- last period was in February 2024; will be doing follow up for iron infusions early next week;   Objective:  Vitals:   06/23/22 0934  BP: 112/72  Pulse: 85  Resp: 18  SpO2: 100%  Weight: 160 lb (72.6 kg)  Height: 5\' 5"  (1.651 m)    General: Well developed, well nourished, in  no acute distress  Skin : Warm and dry.  Head: Normocephalic and atraumatic  Lungs: Respirations unlabored; clear to auscultation bilaterally without wheeze, rales, rhonchi  CVS exam: normal rate and regular rhythm.  Neurologic: Alert and oriented; speech intact; face symmetrical; moves all extremities well; CNII-XII intact without focal deficit   Assessment:  1. Insomnia, unspecified type   2. Anxiety     Plan:  Rx for Lexapro 10 mg 1 po qd- risks and benefits discussed; Rx for Ambien 5 mg qhs prn; follow up in 1 month, sooner prn to discuss response.   Return in about 1 month (around 07/23/2022) for virtual visit, follow up.  No orders of the defined types were placed in this encounter.   Requested Prescriptions   Signed Prescriptions Disp Refills   escitalopram (LEXAPRO) 10 MG tablet 90 tablet 0    Sig: Take 1 tablet (10 mg total) by mouth daily.   zolpidem (AMBIEN) 5 MG tablet 30 tablet 1    Sig: Take 1 tablet (5 mg total) by mouth at bedtime as needed for sleep.

## 2022-06-26 ENCOUNTER — Encounter: Payer: Self-pay | Admitting: Family

## 2022-06-26 ENCOUNTER — Inpatient Hospital Stay: Payer: BC Managed Care – PPO | Attending: Hematology & Oncology

## 2022-06-26 ENCOUNTER — Inpatient Hospital Stay (HOSPITAL_BASED_OUTPATIENT_CLINIC_OR_DEPARTMENT_OTHER): Payer: BC Managed Care – PPO | Admitting: Family

## 2022-06-26 VITALS — BP 125/63 | HR 70 | Temp 98.9°F | Resp 17 | Wt 158.0 lb

## 2022-06-26 DIAGNOSIS — D509 Iron deficiency anemia, unspecified: Secondary | ICD-10-CM | POA: Diagnosis not present

## 2022-06-26 DIAGNOSIS — D500 Iron deficiency anemia secondary to blood loss (chronic): Secondary | ICD-10-CM

## 2022-06-26 LAB — CBC WITH DIFFERENTIAL (CANCER CENTER ONLY)
Abs Immature Granulocytes: 0.04 10*3/uL (ref 0.00–0.07)
Basophils Absolute: 0 10*3/uL (ref 0.0–0.1)
Basophils Relative: 1 %
Eosinophils Absolute: 0.1 10*3/uL (ref 0.0–0.5)
Eosinophils Relative: 1 %
HCT: 37.5 % (ref 36.0–46.0)
Hemoglobin: 12.7 g/dL (ref 12.0–15.0)
Immature Granulocytes: 1 %
Lymphocytes Relative: 28 %
Lymphs Abs: 1.5 10*3/uL (ref 0.7–4.0)
MCH: 33.2 pg (ref 26.0–34.0)
MCHC: 33.9 g/dL (ref 30.0–36.0)
MCV: 98.2 fL (ref 80.0–100.0)
Monocytes Absolute: 0.3 10*3/uL (ref 0.1–1.0)
Monocytes Relative: 6 %
Neutro Abs: 3.4 10*3/uL (ref 1.7–7.7)
Neutrophils Relative %: 63 %
Platelet Count: 250 10*3/uL (ref 150–400)
RBC: 3.82 MIL/uL — ABNORMAL LOW (ref 3.87–5.11)
RDW: 11.9 % (ref 11.5–15.5)
WBC Count: 5.4 10*3/uL (ref 4.0–10.5)
nRBC: 0 % (ref 0.0–0.2)

## 2022-06-26 LAB — FERRITIN: Ferritin: 118 ng/mL (ref 11–307)

## 2022-06-26 LAB — RETICULOCYTES
Immature Retic Fract: 5.9 % (ref 2.3–15.9)
RBC.: 3.87 MIL/uL (ref 3.87–5.11)
Retic Count, Absolute: 75.1 10*3/uL (ref 19.0–186.0)
Retic Ct Pct: 1.9 % (ref 0.4–3.1)

## 2022-06-26 NOTE — Progress Notes (Signed)
Hematology and Oncology Follow Up Visit  Kathy Craig [id number] 1970-08-15 52 y.o. 06/26/2022   Principle Diagnosis:  Iron deficiency anemia     Current Therapy:        IV iron as indicated    Interim History:  Kathy Craig is here today for follow-up. She is doing quite well and has no complaints at this time.  No issues with blood loss. No petechiae or bruising.  No fever, chills, n/v, cough, rash, SOB, chest pain, palpitations, abdominal pain or changes in bowel or bladder habits.  She notes a little dizziness the last couple days while adjusting to new medications Lexapro and Ambien.  No swelling, tenderness, numbness or tingling in her extremities.  No falls or syncope.  Appetite and hydration are good. Weight is stable at 158 lbs.   ECOG Performance Status: 1 - Symptomatic but completely ambulatory  Medications:  Allergies as of 06/26/2022   No Known Allergies      Medication List        Accurate as of June 26, 2022  1:42 PM. If you have any questions, ask your nurse or doctor.          escitalopram 10 MG tablet Commonly known as: Lexapro Take 1 tablet (10 mg total) by mouth daily.   zolpidem 5 MG tablet Commonly known as: AMBIEN Take 1 tablet (5 mg total) by mouth at bedtime as needed for sleep.        Allergies: No Known Allergies  Past Medical History, Surgical history, Social history, and Family History were reviewed and updated.  Review of Systems: All other 10 point review of systems is negative.   Physical Exam:  vitals were not taken for this visit.   Wt Readings from Last 3 Encounters:  06/23/22 160 lb (72.6 kg)  01/23/22 161 lb 1.9 oz (73.1 kg)  12/09/21 166 lb 3.2 oz (75.4 kg)    Ocular: Sclerae unicteric, pupils equal, round and reactive to light Ear-nose-throat: Oropharynx clear, dentition fair Lymphatic: No cervical or supraclavicular adenopathy Lungs no rales or rhonchi, good excursion bilaterally Heart regular rate and rhythm,  no murmur appreciated Abd soft, nontender, positive bowel sounds MSK no focal spinal tenderness, no joint edema Neuro: non-focal, well-oriented, appropriate affect Breasts: Deferred   Lab Results  Component Value Date   WBC 4.7 03/27/2022   HGB 11.8 (L) 03/27/2022   HCT 36.4 03/27/2022   MCV 95.8 03/27/2022   PLT 301 03/27/2022   Lab Results  Component Value Date   FERRITIN 167 03/27/2022   IRON 116 03/27/2022   TIBC 266 03/27/2022   UIBC 150 03/27/2022   IRONPCTSAT 44 (H) 03/27/2022   Lab Results  Component Value Date   RETICCTPCT 2.3 03/27/2022   RBC 3.67 (L) 03/27/2022   RBC 3.80 (L) 03/27/2022   No results found for: "KPAFRELGTCHN", "LAMBDASER", "KAPLAMBRATIO" No results found for: "IGGSERUM", "IGA", "IGMSERUM" No results found for: "TOTALPROTELP", "ALBUMINELP", "A1GS", "A2GS", "BETS", "BETA2SER", "GAMS", "MSPIKE", "SPEI"   Chemistry      Component Value Date/Time   NA 140 01/23/2022 1443   K 4.1 01/23/2022 1443   CL 105 01/23/2022 1443   CO2 28 01/23/2022 1443   BUN 13 01/23/2022 1443   CREATININE 0.75 01/23/2022 1443   CREATININE 0.58 06/23/2016 1551      Component Value Date/Time   CALCIUM 9.5 01/23/2022 1443   ALKPHOS 62 01/23/2022 1443   AST 17 01/23/2022 1443   ALT 10 01/23/2022 1443   BILITOT 0.9 01/23/2022  1443       Impression and Plan: Kathy Craig is a very pleasant 52 yo African American female with history of iron deficiency anemia secondary to heavy cycles.  Iron studies pending.  Follow-up in 4 months.   Eileen Stanford, NP 4/15/20241:42 PM

## 2022-06-27 LAB — IRON AND IRON BINDING CAPACITY (CC-WL,HP ONLY)
Iron: 120 ug/dL (ref 28–170)
Saturation Ratios: 38 % — ABNORMAL HIGH (ref 10.4–31.8)
TIBC: 315 ug/dL (ref 250–450)
UIBC: 195 ug/dL (ref 148–442)

## 2022-07-25 ENCOUNTER — Telehealth: Payer: BC Managed Care – PPO | Admitting: Family

## 2022-09-18 ENCOUNTER — Other Ambulatory Visit: Payer: Self-pay | Admitting: Family

## 2022-10-25 ENCOUNTER — Inpatient Hospital Stay: Payer: BC Managed Care – PPO | Admitting: Family

## 2022-10-25 ENCOUNTER — Inpatient Hospital Stay: Payer: BC Managed Care – PPO

## 2022-10-30 ENCOUNTER — Inpatient Hospital Stay: Payer: BC Managed Care – PPO

## 2022-10-30 ENCOUNTER — Inpatient Hospital Stay: Payer: BC Managed Care – PPO | Admitting: Family

## 2022-11-09 IMAGING — DX DG CHEST 2V
2 series · 2 of 2 positions shown · non-contrast
Comparison: 04/07/2020

CLINICAL DATA: 50-year-old female with a history of History of
pneumonia

EXAM:
CHEST - 2 VIEW

[chest pa]
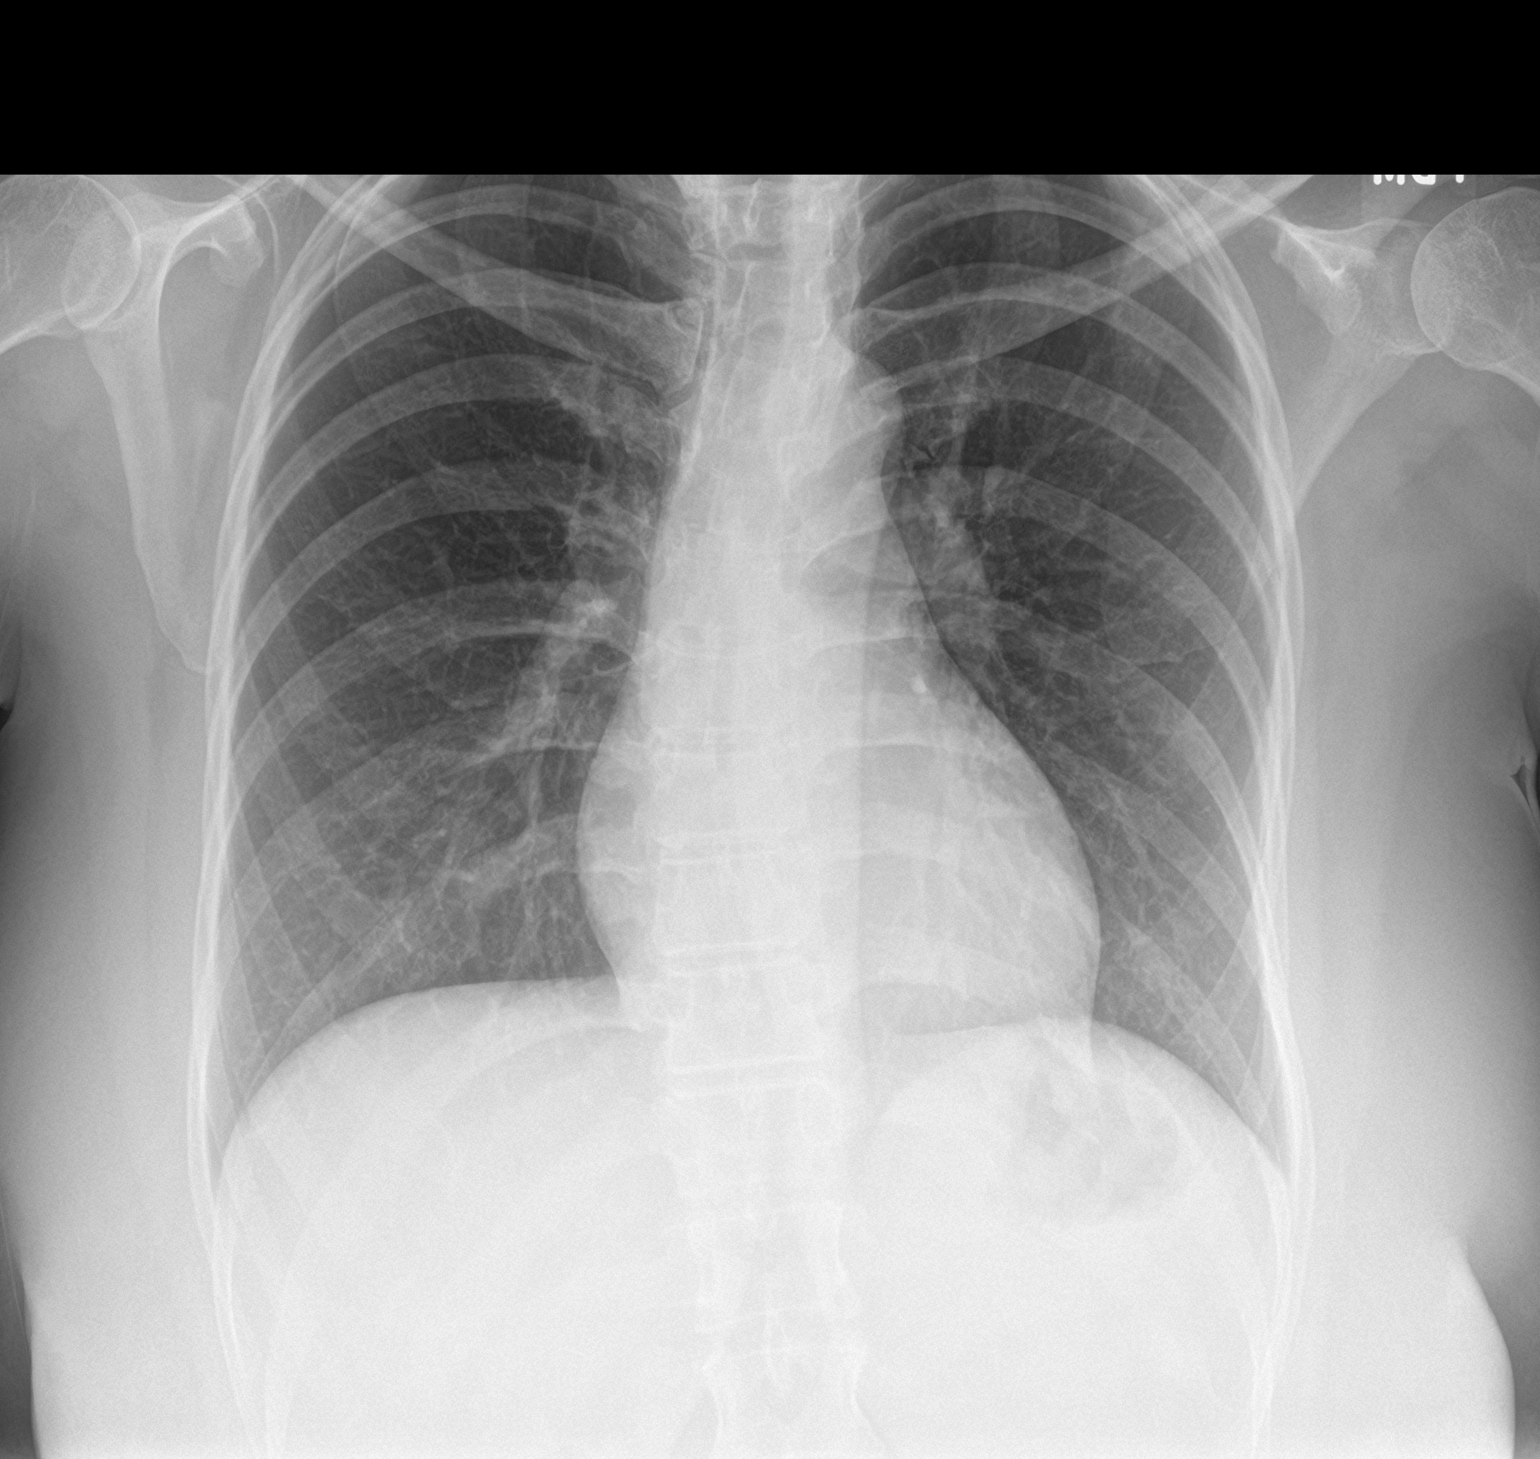

[chest lat]
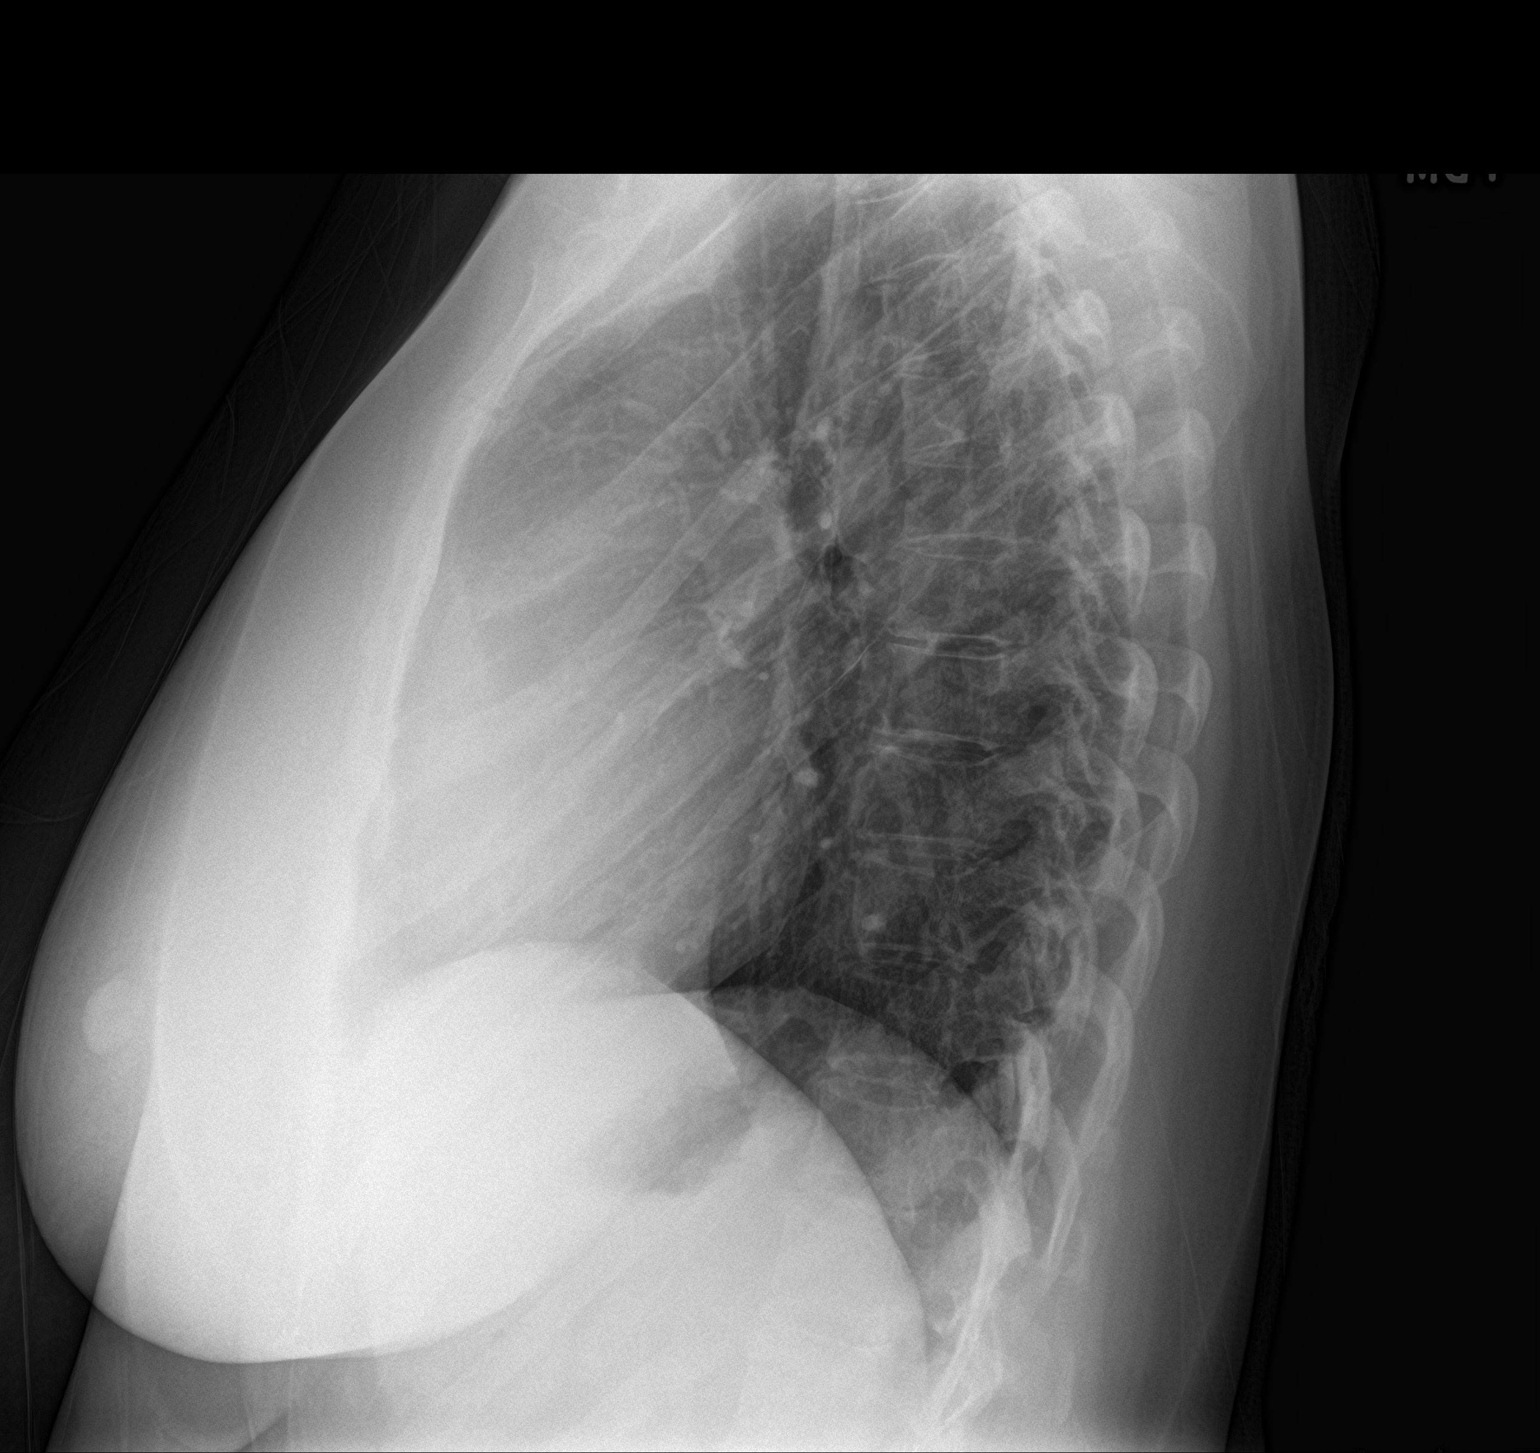

[2 of 2 positions shown; findings below may reference images not displayed]

FINDINGS: Cardiomediastinal silhouette unchanged in size and contour. No
evidence of central vascular congestion. No interlobular septal
thickening.

Interval resolution of the reticulonodular opacities at the right
lung base, with no evidence new confluent airspace disease.

No pneumothorax or pleural effusion.

No acute displaced fracture. Scoliotic curvature again demonstrated
IMPRESSION: Interval resolution of prior pneumonia.

## 2022-11-28 ENCOUNTER — Other Ambulatory Visit: Payer: Self-pay | Admitting: Family

## 2022-11-28 DIAGNOSIS — Z1231 Encounter for screening mammogram for malignant neoplasm of breast: Secondary | ICD-10-CM

## 2022-12-19 DIAGNOSIS — Z1231 Encounter for screening mammogram for malignant neoplasm of breast: Secondary | ICD-10-CM

## 2023-01-16 ENCOUNTER — Other Ambulatory Visit: Payer: Self-pay | Admitting: Obstetrics and Gynecology

## 2023-01-16 DIAGNOSIS — Z1231 Encounter for screening mammogram for malignant neoplasm of breast: Secondary | ICD-10-CM

## 2023-01-17 DIAGNOSIS — Z1231 Encounter for screening mammogram for malignant neoplasm of breast: Secondary | ICD-10-CM

## 2023-01-23 ENCOUNTER — Encounter: Payer: Self-pay | Admitting: Family

## 2023-01-23 ENCOUNTER — Ambulatory Visit (INDEPENDENT_AMBULATORY_CARE_PROVIDER_SITE_OTHER): Payer: BC Managed Care – PPO | Admitting: Family

## 2023-01-23 ENCOUNTER — Ambulatory Visit (HOSPITAL_BASED_OUTPATIENT_CLINIC_OR_DEPARTMENT_OTHER)
Admission: RE | Admit: 2023-01-23 | Discharge: 2023-01-23 | Disposition: A | Discharge status: Home / Self Care | Payer: BC Managed Care – PPO | Source: Ambulatory Visit | Attending: Family | Admitting: Family

## 2023-01-23 VITALS — BP 126/80 | HR 69 | Resp 18 | Ht 65.0 in | Wt 164.2 lb

## 2023-01-23 DIAGNOSIS — R109 Unspecified abdominal pain: Secondary | ICD-10-CM | POA: Insufficient documentation

## 2023-01-23 DIAGNOSIS — M47816 Spondylosis without myelopathy or radiculopathy, lumbar region: Secondary | ICD-10-CM | POA: Diagnosis not present

## 2023-01-23 DIAGNOSIS — G4700 Insomnia, unspecified: Secondary | ICD-10-CM | POA: Diagnosis not present

## 2023-01-23 DIAGNOSIS — M25552 Pain in left hip: Secondary | ICD-10-CM | POA: Diagnosis not present

## 2023-01-23 DIAGNOSIS — M545 Low back pain, unspecified: Secondary | ICD-10-CM | POA: Diagnosis not present

## 2023-01-23 MED ORDER — METHOCARBAMOL 500 MG PO TABS: 500.0000 mg | ORAL_TABLET | Freq: Three times a day (TID) | ORAL | 0 refills | Status: DC | PRN

## 2023-01-23 MED ORDER — ZOLPIDEM TARTRATE 5 MG PO TABS: 5.0000 mg | ORAL_TABLET | Freq: Every evening | ORAL | 1 refills | Status: DC | PRN

## 2023-01-23 NOTE — Progress Notes (Signed)
Semika Zou is a 52 y.o. female with the following history as recorded in EpicCare:  Patient Active Problem List   Diagnosis Date Noted   Fibroids 07/07/2018   Iron deficiency 02/14/2018    Current Outpatient Medications  Medication Sig Dispense Refill   escitalopram (LEXAPRO) 10 MG tablet TAKE 1 TABLET BY MOUTH EVERY DAY 90 tablet 1   methocarbamol (ROBAXIN) 500 MG tablet Take 1 tablet (500 mg total) by mouth every 8 (eight) hours as needed for muscle spasms. 30 tablet 0   zolpidem (AMBIEN) 5 MG tablet Take 1 tablet (5 mg total) by mouth at bedtime as needed for sleep. 30 tablet 1   No current facility-administered medications for this visit.    Allergies: Patient has no known allergies.  Past Medical History:  Diagnosis Date   Anemia 11/26/2019   10.1 - [facility]   Elevated hemoglobin A1c 11/26/2019   5.9 - [facility]    Fibroid    5.5 cm possible partially submucous fibroid   Hydrosalpinx 10/15/14   right   Low vitamin D level 11/26/2019   16.04 - [facility]    Past Surgical History:  Procedure Laterality Date   mirena     removed 2013    Family History  Problem Relation Age of Onset   Heart disease Mother        CHF   Hypertension Mother    Diabetes Father    Sarcoidosis Sister 48       deceased as complication of   Heart attack Maternal Uncle    Breast cancer Maternal Grandmother 28        ? death from breast cancer   Hypertension Maternal Grandmother     Social History   Tobacco Use   Smoking status: Never   Smokeless tobacco: Never  Substance Use Topics   Alcohol use: Yes    Comment: socially    Subjective:   3-4 month history of left sided flank pain- most noticeable after getting off work/ job does require standing 9-10 hours/ day; notices that pain is only present on the days when she has to work/ stand; some relief with OTC Motrin- has only started to take Motrin in the past month;  Did have abd/ pelvic CT in 11/2021- showed  extensive uterine fibroids;    Objective:  Vitals:   01/23/23 1453  BP: 126/80  Pulse: 69  Resp: 18  SpO2: 96%  Weight: 164 lb 3.2 oz (74.5 kg)  Height: 5\' 5"  (1.651 m)    General: Well developed, well nourished, in no acute distress  Skin : Warm and dry.  Head: Normocephalic and atraumatic  Lungs: Respirations unlabored; clear to auscultation bilaterally without wheeze, rales, rhonchi  CVS exam: normal rate and regular rhythm.  Musculoskeletal: No deformities; no active joint inflammation  Extremities: No edema, cyanosis, clubbing  Vessels: Symmetric bilaterally  Neurologic: Alert and oriented; speech intact; face symmetrical; moves all extremities well; CNII-XII intact without focal deficit   Assessment:  1. Flank pain   2. Insomnia, unspecified type     Plan:  Suspect muscular source; will update lumbar X-ray and left hip X-ray today; patient agrees to trial of Robaxin; she wants to continue Motrin as needed;  Will check CBC, CMP, U/A- to consider further imaging if blood noted in urine; patient notes she will not be having hysterectomy as her periods have stopped/ has not seen her GYN since 11/2021;  Follow up to be determined;  Stable; refill updated;  No follow-ups on file.  Orders Placed This Encounter  Procedures   DG Lumbar Spine Complete    Standing Status:   Future    Standing Expiration Date:   01/23/2024    Order Specific Question:   Reason for Exam (SYMPTOM  OR DIAGNOSIS REQUIRED)    Answer:   low back pain    Order Specific Question:   Is patient pregnant?    Answer:   No    Order Specific Question:   Preferred imaging location?    Answer:   [facility]   DG HIP UNILAT WITH PELVIS 2-3 VIEWS LEFT    Standing Status:   Future    Standing Expiration Date:   01/23/2024    Order Specific Question:   Reason for Exam (SYMPTOM  OR DIAGNOSIS REQUIRED)    Answer:   left hip pain    Order Specific Question:   Is patient pregnant?    Answer:   No     Order Specific Question:   Preferred imaging location?    Answer:   [facility]   CBC with Differential/Platelet   Comp Met (CMET)   Urinalysis, Routine w reflex microscopic    Requested Prescriptions   Signed Prescriptions Disp Refills   methocarbamol (ROBAXIN) 500 MG tablet 30 tablet 0    Sig: Take 1 tablet (500 mg total) by mouth every 8 (eight) hours as needed for muscle spasms.   zolpidem (AMBIEN) 5 MG tablet 30 tablet 1    Sig: Take 1 tablet (5 mg total) by mouth at bedtime as needed for sleep.

## 2023-01-24 LAB — COMPREHENSIVE METABOLIC PANEL
ALT: 11 U/L (ref 0–35)
AST: 16 U/L (ref 0–37)
Albumin: 4.3 g/dL (ref 3.5–5.2)
Alkaline Phosphatase: 69 U/L (ref 39–117)
BUN: 13 mg/dL (ref 6–23)
CO2: 30 meq/L (ref 19–32)
Calcium: 9.7 mg/dL (ref 8.4–10.5)
Chloride: 101 meq/L (ref 96–112)
Creatinine, Ser: 0.71 mg/dL (ref 0.40–1.20)
GFR: 98.23 mL/min (ref >60.00)
Glucose, Bld: 120 mg/dL — ABNORMAL HIGH (ref 70–99)
Potassium: 4.3 meq/L (ref 3.5–5.1)
Sodium: 138 meq/L (ref 135–145)
Total Bilirubin: 1.1 mg/dL (ref 0.2–1.2)
Total Protein: 7.7 g/dL (ref 6.0–8.3)

## 2023-01-24 LAB — URINALYSIS, ROUTINE W REFLEX MICROSCOPIC
Bilirubin Urine: NEGATIVE
Hgb urine dipstick: NEGATIVE
Ketones, ur: NEGATIVE
Leukocytes,Ua: NEGATIVE
Nitrite: NEGATIVE
RBC / HPF: NONE SEEN (ref 0–?)
Specific Gravity, Urine: 1.02 (ref 1.000–1.030)
Total Protein, Urine: NEGATIVE
Urine Glucose: NEGATIVE
Urobilinogen, UA: 0.2 (ref 0.0–1.0)
pH: 6 (ref 5.0–8.0)

## 2023-01-24 LAB — CBC WITH DIFFERENTIAL/PLATELET
Basophils Absolute: 0.1 10*3/uL (ref 0.0–0.1)
Basophils Relative: 0.9 % (ref 0.0–3.0)
Eosinophils Absolute: 0.1 10*3/uL (ref 0.0–0.7)
Eosinophils Relative: 2.3 % (ref 0.0–5.0)
HCT: 40.4 % (ref 36.0–46.0)
Hemoglobin: 13.4 g/dL (ref 12.0–15.0)
Lymphocytes Relative: 25.7 % (ref 12.0–46.0)
Lymphs Abs: 1.4 10*3/uL (ref 0.7–4.0)
MCHC: 33.3 g/dL (ref 30.0–36.0)
MCV: 100.8 fL — ABNORMAL HIGH (ref 78.0–100.0)
Monocytes Absolute: 0.4 10*3/uL (ref 0.1–1.0)
Monocytes Relative: 6.6 % (ref 3.0–12.0)
Neutro Abs: 3.6 10*3/uL (ref 1.4–7.7)
Neutrophils Relative %: 64.5 % (ref 43.0–77.0)
Platelets: 282 10*3/uL (ref 150.0–400.0)
RBC: 4.01 Mil/uL (ref 3.87–5.11)
RDW: 13 % (ref 11.5–15.5)
WBC: 5.5 10*3/uL (ref 4.0–10.5)

## 2023-01-31 ENCOUNTER — Encounter: Payer: Self-pay | Admitting: Family

## 2023-02-01 ENCOUNTER — Other Ambulatory Visit: Payer: Self-pay | Admitting: Family

## 2023-02-01 DIAGNOSIS — M25552 Pain in left hip: Secondary | ICD-10-CM

## 2023-02-01 DIAGNOSIS — M549 Dorsalgia, unspecified: Secondary | ICD-10-CM

## 2023-03-29 ENCOUNTER — Other Ambulatory Visit: Payer: Self-pay | Admitting: Family

## 2023-04-02 DIAGNOSIS — J111 Influenza due to unidentified influenza virus with other respiratory manifestations: Secondary | ICD-10-CM | POA: Diagnosis not present

## 2023-04-02 DIAGNOSIS — R059 Cough, unspecified: Secondary | ICD-10-CM | POA: Diagnosis not present

## 2023-04-02 DIAGNOSIS — R079 Chest pain, unspecified: Secondary | ICD-10-CM | POA: Diagnosis not present

## 2023-04-02 DIAGNOSIS — R112 Nausea with vomiting, unspecified: Secondary | ICD-10-CM | POA: Diagnosis not present

## 2023-04-02 DIAGNOSIS — R509 Fever, unspecified: Secondary | ICD-10-CM | POA: Diagnosis not present

## 2023-04-02 DIAGNOSIS — R5383 Other fatigue: Secondary | ICD-10-CM | POA: Diagnosis not present

## 2023-04-02 DIAGNOSIS — J029 Acute pharyngitis, unspecified: Secondary | ICD-10-CM | POA: Diagnosis not present

## 2023-04-02 DIAGNOSIS — Z20822 Contact with and (suspected) exposure to covid-19: Secondary | ICD-10-CM | POA: Diagnosis not present

## 2023-04-02 DIAGNOSIS — J101 Influenza due to other identified influenza virus with other respiratory manifestations: Secondary | ICD-10-CM | POA: Diagnosis not present

## 2023-04-02 DIAGNOSIS — R0602 Shortness of breath: Secondary | ICD-10-CM | POA: Diagnosis not present

## 2023-04-09 ENCOUNTER — Ambulatory Visit: Payer: BC Managed Care – PPO | Admitting: Family Medicine

## 2023-04-21 DIAGNOSIS — J1000 Influenza due to other identified influenza virus with unspecified type of pneumonia: Secondary | ICD-10-CM | POA: Diagnosis not present

## 2023-04-24 ENCOUNTER — Ambulatory Visit: Payer: BC Managed Care – PPO | Admitting: Family

## 2023-09-07 ENCOUNTER — Ambulatory Visit (INDEPENDENT_AMBULATORY_CARE_PROVIDER_SITE_OTHER): Admitting: Family

## 2023-09-07 ENCOUNTER — Encounter: Payer: Self-pay | Admitting: Family

## 2023-09-07 VITALS — BP 128/76 | HR 76 | Ht 65.0 in | Wt 166.4 lb

## 2023-09-07 DIAGNOSIS — M795 Residual foreign body in soft tissue: Secondary | ICD-10-CM

## 2023-09-07 DIAGNOSIS — G4700 Insomnia, unspecified: Secondary | ICD-10-CM | POA: Diagnosis not present

## 2023-09-07 DIAGNOSIS — F419 Anxiety disorder, unspecified: Secondary | ICD-10-CM | POA: Diagnosis not present

## 2023-09-07 DIAGNOSIS — Z23 Encounter for immunization: Secondary | ICD-10-CM | POA: Diagnosis not present

## 2023-09-07 MED ORDER — ESCITALOPRAM OXALATE 10 MG PO TABS: 10.0000 mg | ORAL_TABLET | Freq: Every day | ORAL | 3 refills | Status: DC

## 2023-09-07 MED ORDER — ZOLPIDEM TARTRATE 5 MG PO TABS: 5.0000 mg | ORAL_TABLET | Freq: Every evening | ORAL | 1 refills | Status: DC | PRN

## 2023-09-07 MED ORDER — DOXYCYCLINE HYCLATE 100 MG PO TABS: 100.0000 mg | ORAL_TABLET | Freq: Two times a day (BID) | ORAL | 0 refills | Status: DC

## 2023-09-07 NOTE — Patient Instructions (Signed)
 Please keep doing the Epsom salt soaks daily if possible; podiatry should be calling you very soon;

## 2023-09-07 NOTE — Progress Notes (Signed)
 Kathy Craig is a 53 y.o. female with the following history as recorded in EpicCare:  Patient Active Problem List   Diagnosis Date Noted   Fibroids 07/07/2018   Iron  deficiency 02/14/2018    Current Outpatient Medications  Medication Sig Dispense Refill   doxycycline (VIBRA-TABS) 100 MG tablet Take 1 tablet (100 mg total) by mouth 2 (two) times daily. 14 tablet 0   escitalopram  (LEXAPRO ) 10 MG tablet Take 1 tablet (10 mg total) by mouth daily. 90 tablet 3   zolpidem  (AMBIEN ) 5 MG tablet Take 1 tablet (5 mg total) by mouth at bedtime as needed for sleep. 30 tablet 1   No current facility-administered medications for this visit.    Allergies: Patient has no known allergies.  Past Medical History:  Diagnosis Date   Anemia 11/26/2019   10.1 - [facility]   Elevated hemoglobin A1c 11/26/2019   5.9 - [facility]    Fibroid    5.5 cm possible partially submucous fibroid   Hydrosalpinx 10/15/14   right   Low vitamin D  level 11/26/2019   16.04 - [facility]    Past Surgical History:  Procedure Laterality Date   mirena     removed 2013    Family History  Problem Relation Age of Onset   Heart disease Mother        CHF   Hypertension Mother    Diabetes Father    Sarcoidosis Sister 1       deceased as complication of   Heart attack Maternal Uncle    Breast cancer Maternal Grandmother 58        ? death from breast cancer   Hypertension Maternal Grandmother     Social History   Tobacco Use   Smoking status: Never   Smokeless tobacco: Never  Substance Use Topics   Alcohol use: Yes    Comment: socially    Subjective:   Stepped on piece of glass with left foot x 3 weeks; concerned that piece of glass may still be stuck in foot; has done Epsom salt soaks on 1 occasion but did not notice any change;     Objective:  Vitals:   09/07/23 1024  BP: 128/76  Pulse: 76  SpO2: 98%  Weight: 166 lb 6.4 oz (75.5 kg)  Height: 5' 5 (1.651 m)    General: Well  developed, well nourished, in no acute distress  Skin : Warm and dry. Rough/ raised area of concern at base of 2nd and 3rd toe; no redness/ warmth noted;  Head: Normocephalic and atraumatic  Lungs: Respirations unlabored;  Neurologic: Alert and oriented; speech intact; face symmetrical; moves all extremities well; CNII-XII intact without focal deficit   Assessment:  1. Foreign body (FB) in soft tissue   2. Need for Tdap vaccination   3. Insomnia, unspecified type   4. Anxiety     Plan:  Encouraged to try and do daily Epsom salt soaks to see if she is able to get any remaining shard to come to the surface; unable to visualize concern today; will update Tdap today; Rx for Doxycyline 100 mg bid x 7 days in the interim; 3.   Stable; refill updated; 4.   Stable; refill updated on Ambien  5 mg- patient is taking 2.5 mg daily prn;   No follow-ups on file.  Orders Placed This Encounter  Procedures   Tdap vaccine greater than or equal to 7yo IM   Ambulatory referral to Podiatry    Referral Priority:  Urgent    Referral Type:   Consultation    Referral Reason:   Specialty Services Required    Requested Specialty:   Podiatry    Number of Visits Requested:   1    Requested Prescriptions   Signed Prescriptions Disp Refills   escitalopram  (LEXAPRO ) 10 MG tablet 90 tablet 3    Sig: Take 1 tablet (10 mg total) by mouth daily.   zolpidem  (AMBIEN ) 5 MG tablet 30 tablet 1    Sig: Take 1 tablet (5 mg total) by mouth at bedtime as needed for sleep.   doxycycline (VIBRA-TABS) 100 MG tablet 14 tablet 0    Sig: Take 1 tablet (100 mg total) by mouth 2 (two) times daily.

## 2023-09-13 ENCOUNTER — Ambulatory Visit (INDEPENDENT_AMBULATORY_CARE_PROVIDER_SITE_OTHER): Admitting: Podiatry

## 2023-09-13 ENCOUNTER — Encounter: Payer: Self-pay | Admitting: Podiatry

## 2023-09-13 ENCOUNTER — Ambulatory Visit

## 2023-09-13 DIAGNOSIS — S90852A Superficial foreign body, left foot, initial encounter: Secondary | ICD-10-CM | POA: Diagnosis not present

## 2023-09-13 MED ORDER — MUPIROCIN 2 % EX OINT: 1.0000 | TOPICAL_OINTMENT | Freq: Two times a day (BID) | CUTANEOUS | 2 refills | Status: DC

## 2023-09-13 NOTE — Progress Notes (Signed)
 Chief Complaint  Patient presents with   Foreign Body    glass in left foot x1 month. 8 pain. Non diabetic. Antibacterial spray to treat.    HPI: 53 y.o. female presents today with pain to the left forefoot.  She is concerned for possible foreign body around the level of the 2nd and 3rd mpj plantar aspect level.  Reports that she stepped on a piece of glass about 1 month and has been dealing with pain since then.  He has tried some Epsom salt soaks.  She recently saw her PCP who started her on doxycycline, patient states that she only took a dose of it.  Past Medical History:  Diagnosis Date   Anemia 11/26/2019   10.1 - [facility]   Elevated hemoglobin A1c 11/26/2019   5.9 - [facility]    Fibroid    5.5 cm possible partially submucous fibroid   Hydrosalpinx 10/15/14   right   Low vitamin D  level 11/26/2019   16.04 - [facility]    Past Surgical History:  Procedure Laterality Date   mirena     removed 2013    No Known Allergies  ROS denies any nausea, vomiting, fever, chills, chest pain, shortness of breath   Physical Exam: There were no vitals filed for this visit.  General: The patient is alert and oriented x3 in no acute distress.  Dermatology: Hyperkeratotic lesion left forefoot interval between the 2nd and 3rd metatarsal heads distally in her aspect.  Tender on palpation.  There is a communicating hemorrhagic bulla distal to this towards the sulcus of the toes.  See full procedure note below.  Piece of glass found within the hyperkeratotic lesion less than 0.4 cm in length.  Piece of glass embedded within the subcutaneous tissue, the hemorrhagic bulla is limited to dermal tissue involvement.  No purulence.  Vascular: Palpable pedal pulses bilaterally. Capillary refill within normal limits.  No appreciable edema.  No erythema or calor.  Neurological: Light touch sensation grossly intact bilateral feet.   Musculoskeletal Exam: No pedal deformities  noted  Radiographic Exam: Left foot 3 views weightbearing 09/13/2023 Normal osseous mineralization. Joint spaces preserved.  No fractures or osseous irregularities noted.  Speck of radiodense material noted just lateral to the base of the second proximal phalanx and the plantar soft tissues.  Assessment/Plan of Care: 1. Foreign body in left foot, initial encounter      Meds ordered this encounter  Medications   mupirocin ointment (BACTROBAN) 2 %    Sig: Apply 1 Application topically 2 (two) times daily.    Dispense:  30 g    Refill:  2   DG FOOT COMPLETE LEFT  Discussed clinical findings with patient today.  Radiographs reviewed with the patient.  Findings concerning for foreign body at left foot. - See full procedure note below for findings - Cleansed with wound cleanser spray. - Dressed with Silvadene and bandage. - Instructed patient to complete her course of oral doxycycline out of abundance of caution - Prescription sent in for topical mupirocin to be applied to the wound bed 1-2 times daily. - Avoid foot soaks, ok to shower, recommend cleaning the wound with wound cleanser or soapy water following this. - Follow-up in 2 weeks to monitor healing progression - Anticipate patient will be okay to return back to work in the next 2 to 3 days.  Procedure: Left foot foreign body removal - Skin prep: Betadine paint - Anesthesia: 1.5 cc  of a 1 ratio of 2% lidocaine plain 0.5% Marcaine plain - Instrumentation: 15 blade, pickups, curette - Findings: Debrided hyperkeratotic lesion left plantar forefoot, encountered small glass shard approximately 0.4 cm in length.  This was removed without incident.  Did debride the overlying communicating hemorrhagic bulla.  Focal area with raw subcutaneous tissue noted to develop medial aspects distally, piece of glass was noted to be embedded within the subcutaneous tissue as well. - Cleansed the area with wound cleanser spray and dressing was applied as  described above - Tylenol  and ibuprofen  as needed for pain - Offloading felt padding applied   Perri Aragones L. Lamount MAUL, AACFAS [facility]     [address] [place], [place] [address]                Office [phone]  Fax [phone]

## 2023-09-28 ENCOUNTER — Ambulatory Visit: Admitting: Podiatry

## 2023-10-17 ENCOUNTER — Other Ambulatory Visit: Payer: Self-pay | Admitting: Family

## 2023-10-17 DIAGNOSIS — Z1231 Encounter for screening mammogram for malignant neoplasm of breast: Secondary | ICD-10-CM

## 2023-10-24 ENCOUNTER — Other Ambulatory Visit: Payer: Self-pay | Admitting: Medical Genetics

## 2023-10-25 ENCOUNTER — Ambulatory Visit: Admitting: Podiatry

## 2023-10-30 ENCOUNTER — Ambulatory Visit
Admission: RE | Admit: 2023-10-30 | Discharge: 2023-10-30 | Disposition: A | Discharge status: Home / Self Care | Source: Ambulatory Visit

## 2023-10-30 DIAGNOSIS — Z1231 Encounter for screening mammogram for malignant neoplasm of breast: Secondary | ICD-10-CM | POA: Diagnosis not present

## 2023-11-20 ENCOUNTER — Encounter: Admitting: Family

## 2023-12-07 ENCOUNTER — Other Ambulatory Visit (HOSPITAL_COMMUNITY)

## 2024-01-18 ENCOUNTER — Encounter: Payer: Self-pay | Admitting: Obstetrics and Gynecology

## 2024-01-28 ENCOUNTER — Ambulatory Visit: Payer: Self-pay

## 2024-01-28 NOTE — Telephone Encounter (Signed)
 Appt scheduled

## 2024-01-28 NOTE — Telephone Encounter (Signed)
 FYI Only or Action Required?: FYI only for provider: appointment scheduled on 01/30/2024.  Patient was last seen in primary care on 09/07/2023 by Jason Leita Repine, FNP.  Called Nurse Triage reporting Insomnia.  Symptoms began several years ago.  Interventions attempted: Prescription medications: lexapro  5 mg.  Symptoms are: gradually worsening.  Triage Disposition: See PCP Within 2 Weeks  Patient/caregiver understands and will follow disposition?: Yes  Reason for Triage: callback [phone] - calling in to schedule with a new PCP but DT denied scheduling. Currently on waitlist to see new provider, but soonest time is April. Unable to sleep, worsening, currently on anxiety medication but not helping   Reason for Disposition  Insomnia is a chronic symptom (recurrent or ongoing AND present > 4 weeks)  Answer Assessment - Initial Assessment Questions 1. DESCRIPTION: Tell me about your sleeping problem. (e.g., waking frequently during night, sleeping during day and awake at night, trouble falling asleep) How bad is it?      Trouble falling asleep and staying asleep Reports that when she tries to sleep at night, her mind will not slow down 2. ONSET: How long have you been having trouble sleeping? (e.g., days, weeks, months; longstanding sleep problems)     10 years 3. DAYTIME SLEEP PATTERN: How much time do you spend sleeping or napping during the day?     No napping 4. STRESSORS: Is there anything that is making you feel stressed? Is there something that worries you?     denies 5. PAIN: Do you have any pain that is keeping you awake? (e.g., back pain, joint pain) If Yes, ask How bad is the pain? (e.g., scale 0-10; mild, moderate, severe).     denies 6. CAFFEINE: Do you drink caffeinated beverages? If Yes, ask How much each day? (e.g., coffee, tea, colas)     One celcius in the morning for breakfast at 10 am 7. ALCOHOL USE OR SUBSTANCE USE: Do you drink alcohol  or use any substances?     denies 8. MEDICINE CHANGE: Has there been any recent change in medicines? (e.g., new medicine started, stopped, or dose changed).      denies 9. TREATMENT: What have you done so far to treat this sleep problem? (e.g., prescription or OTC sleep medicines, herbal or dietary supplements, cannabis, relaxation strategies)     Lexapro  5 mg 10. OTHER SYMPTOMS: Do you have any other symptoms?  (e.g., difficulty breathing)       denies  Protocols used: Insomnia-A-AH

## 2024-01-29 NOTE — Patient Instructions (Incomplete)
It was very nice to see you today!

## 2024-01-29 NOTE — Progress Notes (Signed)
 [facility] [address], [address] [place], [place] [address] [phone]  Date:  01/30/2024   Name:  Kathy Craig   DOB:  [birth year]   MRN:  [id number]  PCP:  Jason Leita Repine, FNP (Inactive)    Chief Complaint: No chief complaint on file.   History of Present Illness:  Kathy Craig is a 53 y.o. very pleasant female patient who presents with the following:  Patient seen today with concern of difficulty sleeping.  She is a former patient of Leita Jason, FNP who has left our practice I have not seen her myself previously. It looks like she is generally healthy, history of uterine fibroids and iron  deficiency  Discussed the use of AI scribe software for clinical note transcription with the patient, who gave verbal consent to proceed.  History of Present Illness   - May be due for Cologuard - Flu shot - Shingrix  Patient Active Problem List   Diagnosis Date Noted   Fibroids 07/07/2018   Iron  deficiency 02/14/2018    Past Medical History:  Diagnosis Date   Anemia 11/26/2019   10.1 - [facility]   Elevated hemoglobin A1c 11/26/2019   5.9 - [facility]    Fibroid    5.5 cm possible partially submucous fibroid   Hydrosalpinx 10/15/14   right   Low vitamin D  level 11/26/2019   16.04 - [facility]    Past Surgical History:  Procedure Laterality Date   mirena     removed 2013    Social History   Tobacco Use   Smoking status: Never   Smokeless tobacco: Never  Vaping Use   Vaping status: Never Used  Substance Use Topics   Alcohol use: Yes    Comment: socially   Drug use: No    Family History  Problem Relation Age of Onset   Heart disease Mother        CHF   Hypertension Mother    Diabetes Father    Sarcoidosis Sister 44       deceased as complication of   Heart attack Maternal Uncle    Breast cancer Maternal Grandmother 10        ? death from breast cancer   Hypertension Maternal  Grandmother     No Known Allergies  Medication list has been reviewed and updated.  Current Outpatient Medications on File Prior to Visit  Medication Sig Dispense Refill   doxycycline  (VIBRA -TABS) 100 MG tablet Take 1 tablet (100 mg total) by mouth 2 (two) times daily. 14 tablet 0   escitalopram  (LEXAPRO ) 10 MG tablet Take 1 tablet (10 mg total) by mouth daily. 90 tablet 3   mupirocin  ointment (BACTROBAN ) 2 % Apply 1 Application topically 2 (two) times daily. 30 g 2   zolpidem  (AMBIEN ) 5 MG tablet Take 1 tablet (5 mg total) by mouth at bedtime as needed for sleep. 30 tablet 1   No current facility-administered medications on file prior to visit.    Review of Systems:  As per HPI- otherwise negative.   Physical Examination: There were no vitals filed for this visit. There were no vitals filed for this visit. There is no height or weight on file to calculate BMI. Ideal Body Weight:    GEN: no acute distress. HEENT: Atraumatic, Normocephalic.  Ears and Nose: No external deformity. CV: RRR, No M/G/R. No JVD. No thrill. No extra heart sounds. PULM: CTA B, no wheezes, crackles,  rhonchi. No retractions. No resp. distress. No accessory muscle use. ABD: S, NT, ND, +BS. No rebound. No HSM. EXTR: No c/c/e PSYCH: Normally interactive. Conversant.    Assessment and Plan: No diagnosis found.  Assessment & Plan   Signed Harlene Schroeder, MD

## 2024-01-30 ENCOUNTER — Telehealth: Payer: Self-pay

## 2024-01-30 ENCOUNTER — Encounter: Payer: Self-pay | Admitting: Family Medicine

## 2024-01-30 ENCOUNTER — Ambulatory Visit (INDEPENDENT_AMBULATORY_CARE_PROVIDER_SITE_OTHER): Admitting: Family Medicine

## 2024-01-30 VITALS — BP 122/76 | HR 79 | Ht 65.0 in | Wt 171.4 lb

## 2024-01-30 DIAGNOSIS — F5104 Psychophysiologic insomnia: Secondary | ICD-10-CM | POA: Diagnosis not present

## 2024-01-30 DIAGNOSIS — Z23 Encounter for immunization: Secondary | ICD-10-CM | POA: Diagnosis not present

## 2024-01-30 MED ORDER — ALPRAZOLAM 0.25 MG PO TABS: 0.2500 mg | ORAL_TABLET | Freq: Every evening | ORAL | 0 refills | Status: DC | PRN

## 2024-01-30 NOTE — Telephone Encounter (Signed)
 Copied from CRM #[id number]. Topic: Clinical - Prescription Issue >> Jan 30, 2024  5:19 PM China J wrote: Reason for CRM: The patient's pharmacy is saying that the patient's medication (xanax) has not come through in their system. I let the patient know that we wouldn't be able to resend the medication until tomorrow since the clinic is now closed.

## 2024-01-31 ENCOUNTER — Encounter: Payer: Self-pay | Admitting: Family Medicine

## 2024-01-31 ENCOUNTER — Other Ambulatory Visit: Payer: Self-pay | Admitting: Family Medicine

## 2024-01-31 DIAGNOSIS — F5104 Psychophysiologic insomnia: Secondary | ICD-10-CM

## 2024-01-31 MED ORDER — ALPRAZOLAM 0.25 MG PO TABS
0.2500 mg | ORAL_TABLET | Freq: Every evening | ORAL | 0 refills | Status: DC | PRN
Start: 2024-01-31 — End: 2024-02-06

## 2024-02-06 MED ORDER — ALPRAZOLAM 0.5 MG PO TABS
0.5000 mg | ORAL_TABLET | Freq: Every day | ORAL | 2 refills | Status: AC
Start: 2024-02-06 — End: ?

## 2024-02-11 ENCOUNTER — Ambulatory Visit
Admission: RE | Admit: 2024-02-11 | Discharge: 2024-02-11 | Disposition: A | Discharge status: Home / Self Care | Attending: Family Medicine | Admitting: Family Medicine

## 2024-02-11 VITALS — BP 127/66 | HR 76 | Temp 98.3°F | Resp 18 | Ht 65.0 in | Wt 162.0 lb

## 2024-02-11 DIAGNOSIS — R0981 Nasal congestion: Secondary | ICD-10-CM | POA: Diagnosis not present

## 2024-02-11 DIAGNOSIS — J029 Acute pharyngitis, unspecified: Secondary | ICD-10-CM

## 2024-02-11 DIAGNOSIS — H66001 Acute suppurative otitis media without spontaneous rupture of ear drum, right ear: Secondary | ICD-10-CM

## 2024-02-11 LAB — POC SOFIA SARS ANTIGEN FIA: SARS Coronavirus 2 Ag: NEGATIVE

## 2024-02-11 LAB — POCT RAPID STREP A (OFFICE): Rapid Strep A Screen: NEGATIVE

## 2024-02-11 LAB — POCT INFLUENZA A/B
Influenza A, POC: NEGATIVE
Influenza B, POC: NEGATIVE

## 2024-02-11 MED ORDER — AMOXICILLIN-POT CLAVULANATE 875-125 MG PO TABS: 1.0000 | ORAL_TABLET | Freq: Two times a day (BID) | ORAL | 0 refills | Status: AC

## 2024-02-11 MED ORDER — ONDANSETRON HCL 8 MG PO TABS: 8.0000 mg | ORAL_TABLET | Freq: Three times a day (TID) | ORAL | 0 refills | Status: AC | PRN

## 2024-02-11 MED ORDER — PREDNISONE 20 MG PO TABS: 40.0000 mg | ORAL_TABLET | Freq: Every day | ORAL | 0 refills | Status: AC

## 2024-02-11 NOTE — Discharge Instructions (Signed)
 Drink lots of fluids Take the Augmentin antibiotic 2 times a day Take the prednisone once a day for 5 days Take all medicines with food May take Tylenol  or ibuprofen  as needed for pain See your doctor if not improving towards the end of the week  Call for problems

## 2024-02-11 NOTE — ED Triage Notes (Signed)
 Patient c/o sore throat, headache, vomiting x 2 days.  Patient has taken Alka-Seltzer and Motrin  200mg  x 2.

## 2024-02-11 NOTE — ED Provider Notes (Signed)
 [facility]    CSN: [id number] Arrival date & time: 02/11/24  0956      History   Chief Complaint Chief Complaint  Patient presents with   Cough    Sore throat, headache, nasal issues - Entered by patient   Sore Throat    HPI Kathy Craig is a 53 y.o. female.   Patient has sore throat headache and sinus congestion.  She has been sick for a couple of days.  She states she feels very tired.  No known exposure to illness.  No recent travel.  No coughing or lung symptoms.  No known underlying lung disease or asthma.  She does have iron  deficiency anemia.  She states she vomited x 1    Past Medical History:  Diagnosis Date   Anemia 11/26/2019   10.1 - [facility]   Elevated hemoglobin A1c 11/26/2019   5.9 - [facility]    Fibroid    5.5 cm possible partially submucous fibroid   Hydrosalpinx 10/15/14   right   Low vitamin D  level 11/26/2019   16.04 - [facility]    Patient Active Problem List   Diagnosis Date Noted   Fibroids 07/07/2018   Iron  deficiency 02/14/2018    Past Surgical History:  Procedure Laterality Date   mirena     removed 2013    OB History     Gravida  3   Para  1   Term  1   Preterm      AB  2   Living  1      SAB      IAB  2   Ectopic      Multiple      Live Births  1            Home Medications    Prior to Admission medications   Medication Sig Start Date End Date Taking? Authorizing Provider  ALPRAZolam  (XANAX ) 0.5 MG tablet Take 1 tablet (0.5 mg total) by mouth at bedtime. 02/06/24  Yes Copland, Jessica C, MD  amoxicillin-clavulanate (AUGMENTIN) 875-125 MG tablet Take 1 tablet by mouth every 12 (twelve) hours. 02/11/24  Yes Maranda Jamee Jacob, MD  ondansetron  (ZOFRAN ) 8 MG tablet Take 1 tablet (8 mg total) by mouth every 8 (eight) hours as needed for nausea or vomiting. 02/11/24  Yes Maranda Jamee Jacob, MD  predniSONE (DELTASONE) 20 MG tablet Take 2 tablets (40 mg total) by mouth daily with  breakfast. 02/11/24  Yes Maranda Jamee Jacob, MD    Family History Family History  Problem Relation Age of Onset   Heart disease Mother        CHF   Hypertension Mother    Diabetes Father    Sarcoidosis Sister 80       deceased as complication of   Heart attack Maternal Uncle    Breast cancer Maternal Grandmother 49        ? death from breast cancer   Hypertension Maternal Grandmother     Social History Social History   Tobacco Use   Smoking status: Never   Smokeless tobacco: Never  Vaping Use   Vaping status: Never Used  Substance Use Topics   Alcohol use: Yes    Comment: socially   Drug use: No     Allergies   Patient has no known allergies.   Review of Systems Review of Systems   Physical Exam Triage Vital Signs ED Triage Vitals  Encounter Vitals Group  BP 02/11/24 1010 127/66     Girls Systolic BP Percentile --      Girls Diastolic BP Percentile --      Boys Systolic BP Percentile --      Boys Diastolic BP Percentile --      Pulse Rate 02/11/24 1010 76     Resp 02/11/24 1010 18     Temp 02/11/24 1010 98.3 F (36.8 C)     Temp Source 02/11/24 1010 Oral     SpO2 02/11/24 1010 97 %     Weight 02/11/24 1009 162 lb (73.5 kg)     Height 02/11/24 1009 5' 5 (1.651 m)     Head Circumference --      Peak Flow --      Pain Score 02/11/24 1009 7     Pain Loc --      Pain Education --      Exclude from Growth Chart --    No data found.  Updated Vital Signs BP 127/66 (BP Location: Right Arm)   Pulse 76   Temp 98.3 F (36.8 C) (Oral)   Resp 18   Ht 5' 5 (1.651 m)   Wt 73.5 kg   LMP 05/10/2022 (Within Days)   SpO2 97%   BMI 26.96 kg/m   Visual Acuity Right Eye Distance:   Left Eye Distance:   Bilateral Distance:    Right Eye Near:   Left Eye Near:    Bilateral Near:     Physical Exam Constitutional:      General: She is not in acute distress.    Appearance: She is well-developed. She is ill-appearing.  HENT:     Head: Normocephalic  and atraumatic.     Right Ear: A middle ear effusion is present. Tympanic membrane is erythematous.     Left Ear: Tympanic membrane normal.     Nose: Congestion present. No rhinorrhea.     Mouth/Throat:     Mouth: Mucous membranes are moist.     Pharynx: Uvula midline. Posterior oropharyngeal erythema present. No pharyngeal swelling.     Tonsils: No tonsillar exudate. 0 on the right. 0 on the left.  Eyes:     Conjunctiva/sclera: Conjunctivae normal.     Pupils: Pupils are equal, round, and reactive to light.  Cardiovascular:     Rate and Rhythm: Normal rate and regular rhythm.  Pulmonary:     Effort: Pulmonary effort is normal. No respiratory distress.     Breath sounds: Normal breath sounds.  Abdominal:     General: There is no distension.     Palpations: Abdomen is soft.  Musculoskeletal:        General: Normal range of motion.     Cervical back: Normal range of motion.  Lymphadenopathy:     Cervical: Cervical adenopathy present.  Skin:    General: Skin is warm and dry.  Neurological:     Mental Status: She is alert.      UC Treatments / Results  Labs (all labs ordered are listed, but only abnormal results are displayed) Labs Reviewed  POC SOFIA SARS ANTIGEN FIA - Normal  POCT INFLUENZA A/B - Normal  POCT RAPID STREP A (OFFICE) - Normal    EKG   Radiology No results found.  Procedures Procedures (including critical care time)  Medications Ordered in UC Medications - No data to display  Initial Impression / Assessment and Plan / UC Course  I have reviewed the triage vital signs and the nursing  notes.  Pertinent labs & imaging results that were available during my care of the patient were reviewed by me and considered in my medical decision making (see chart for details).    Discussed conservative treatment of otitis media See PCP if not improving in a couple days Final Clinical Impressions(s) / UC Diagnoses   Final diagnoses:  Sore throat   Non-recurrent acute suppurative otitis media of right ear without spontaneous rupture of tympanic membrane  Nasal sinus congestion     Discharge Instructions      Drink lots of fluids Take the Augmentin antibiotic 2 times a day Take the prednisone once a day for 5 days Take all medicines with food May take Tylenol  or ibuprofen  as needed for pain See your doctor if not improving towards the end of the week  Call for problems    ED Prescriptions     Medication Sig Dispense Auth. Provider   amoxicillin-clavulanate (AUGMENTIN) 875-125 MG tablet Take 1 tablet by mouth every 12 (twelve) hours. 14 tablet Maranda Jamee Jacob, MD   predniSONE (DELTASONE) 20 MG tablet Take 2 tablets (40 mg total) by mouth daily with breakfast. 10 tablet Maranda Jamee Jacob, MD   ondansetron  (ZOFRAN ) 8 MG tablet Take 1 tablet (8 mg total) by mouth every 8 (eight) hours as needed for nausea or vomiting. 20 tablet Maranda Jamee Jacob, MD      PDMP not reviewed this encounter.   Maranda Jamee Jacob, MD 02/11/24 [phone]

## 2024-02-18 ENCOUNTER — Inpatient Hospital Stay (HOSPITAL_COMMUNITY)
Admission: RE | Admit: 2024-02-18 | Discharge: 2024-02-18 | Payer: Self-pay | Attending: Medical Genetics | Admitting: Medical Genetics

## 2024-02-23 ENCOUNTER — Encounter (INDEPENDENT_AMBULATORY_CARE_PROVIDER_SITE_OTHER): Payer: Self-pay

## 2024-02-23 LAB — COLOGUARD: COLOGUARD: NEGATIVE

## 2024-02-29 LAB — GENECONNECT MOLECULAR SCREEN: Genetic Analysis Overall Interpretation: NEGATIVE

## 2024-07-08 ENCOUNTER — Encounter: Admitting: Family Medicine
